# Patient Record
Sex: Female | Born: 1985 | Race: Black or African American | Hispanic: No | Marital: Single | Smoking: Former smoker
Health system: Southern US, Community
[De-identification: ages and names within clinical notes are randomized; demographics above are authoritative.]

## PROBLEM LIST (undated history)

## (undated) ENCOUNTER — Inpatient Hospital Stay (HOSPITAL_COMMUNITY): Payer: Self-pay

## (undated) DIAGNOSIS — O139 Gestational [pregnancy-induced] hypertension without significant proteinuria, unspecified trimester: Secondary | ICD-10-CM

## (undated) DIAGNOSIS — I10 Essential (primary) hypertension: Secondary | ICD-10-CM

## (undated) DIAGNOSIS — J45909 Unspecified asthma, uncomplicated: Secondary | ICD-10-CM

## (undated) HISTORY — PX: MOUTH SURGERY: SHX715

## (undated) HISTORY — PX: NO PAST SURGERIES: SHX2092

## (undated) HISTORY — PX: WISDOM TOOTH EXTRACTION: SHX21

---
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
## 2004-05-24 ENCOUNTER — Emergency Department (HOSPITAL_COMMUNITY): Admission: EM | Admit: 2004-05-24 | Discharge: 2004-05-24 | Payer: Self-pay | Admitting: Emergency Medicine

## 2006-02-14 ENCOUNTER — Emergency Department (HOSPITAL_COMMUNITY): Admission: EM | Admit: 2006-02-14 | Discharge: 2006-02-15 | Payer: Self-pay | Admitting: Emergency Medicine

## 2006-04-30 ENCOUNTER — Emergency Department (HOSPITAL_COMMUNITY): Admission: EM | Admit: 2006-04-30 | Discharge: 2006-04-30 | Payer: Self-pay | Admitting: Emergency Medicine

## 2006-06-09 ENCOUNTER — Ambulatory Visit: Payer: Self-pay | Admitting: Internal Medicine

## 2006-07-14 ENCOUNTER — Ambulatory Visit: Payer: Self-pay | Admitting: *Deleted

## 2006-07-14 ENCOUNTER — Ambulatory Visit: Payer: Self-pay | Admitting: Internal Medicine

## 2006-08-18 ENCOUNTER — Encounter (INDEPENDENT_AMBULATORY_CARE_PROVIDER_SITE_OTHER): Payer: Self-pay | Admitting: Nurse Practitioner

## 2006-08-18 ENCOUNTER — Encounter (INDEPENDENT_AMBULATORY_CARE_PROVIDER_SITE_OTHER): Payer: Self-pay | Admitting: Family Medicine

## 2006-08-18 ENCOUNTER — Ambulatory Visit: Payer: Self-pay | Admitting: Internal Medicine

## 2006-08-18 LAB — CONVERTED CEMR LAB: Chlamydia, DNA Probe: NEGATIVE

## 2006-08-29 ENCOUNTER — Encounter: Payer: Self-pay | Admitting: Nurse Practitioner

## 2006-08-29 DIAGNOSIS — J45909 Unspecified asthma, uncomplicated: Secondary | ICD-10-CM

## 2006-09-12 ENCOUNTER — Telehealth (INDEPENDENT_AMBULATORY_CARE_PROVIDER_SITE_OTHER): Payer: Self-pay | Admitting: Internal Medicine

## 2006-09-15 ENCOUNTER — Telehealth (INDEPENDENT_AMBULATORY_CARE_PROVIDER_SITE_OTHER): Payer: Self-pay | Admitting: Internal Medicine

## 2006-09-18 ENCOUNTER — Ambulatory Visit: Payer: Self-pay | Admitting: Nurse Practitioner

## 2006-09-18 DIAGNOSIS — K6289 Other specified diseases of anus and rectum: Secondary | ICD-10-CM

## 2007-01-21 ENCOUNTER — Ambulatory Visit: Payer: Self-pay | Admitting: Nurse Practitioner

## 2007-01-21 DIAGNOSIS — J029 Acute pharyngitis, unspecified: Secondary | ICD-10-CM | POA: Insufficient documentation

## 2007-05-15 ENCOUNTER — Telehealth (INDEPENDENT_AMBULATORY_CARE_PROVIDER_SITE_OTHER): Payer: Self-pay | Admitting: Nurse Practitioner

## 2007-05-29 ENCOUNTER — Encounter (INDEPENDENT_AMBULATORY_CARE_PROVIDER_SITE_OTHER): Payer: Self-pay | Admitting: *Deleted

## 2007-10-15 ENCOUNTER — Ambulatory Visit: Payer: Self-pay | Admitting: Nurse Practitioner

## 2007-10-15 DIAGNOSIS — M7989 Other specified soft tissue disorders: Secondary | ICD-10-CM

## 2008-01-07 ENCOUNTER — Emergency Department (HOSPITAL_COMMUNITY): Admission: EM | Admit: 2008-01-07 | Discharge: 2008-01-07 | Payer: Self-pay | Admitting: Emergency Medicine

## 2008-05-06 ENCOUNTER — Ambulatory Visit: Payer: Self-pay | Admitting: Nurse Practitioner

## 2008-05-06 LAB — CONVERTED CEMR LAB
Beta hcg, urine, semiquantitative: POSITIVE
Bilirubin Urine: NEGATIVE
Blood in Urine, dipstick: NEGATIVE
Ketones, urine, test strip: NEGATIVE
Nitrite: NEGATIVE
Specific Gravity, Urine: 1.025
Urobilinogen, UA: 0.2

## 2008-05-22 ENCOUNTER — Inpatient Hospital Stay (HOSPITAL_COMMUNITY): Admission: AD | Admit: 2008-05-22 | Discharge: 2008-05-23 | Payer: Self-pay | Admitting: Family Medicine

## 2008-05-22 ENCOUNTER — Emergency Department (HOSPITAL_COMMUNITY): Admission: EM | Admit: 2008-05-22 | Discharge: 2008-05-22 | Payer: Self-pay | Admitting: Family Medicine

## 2008-05-24 ENCOUNTER — Inpatient Hospital Stay (HOSPITAL_COMMUNITY): Admission: AD | Admit: 2008-05-24 | Discharge: 2008-05-24 | Payer: Self-pay | Admitting: Obstetrics & Gynecology

## 2009-01-22 ENCOUNTER — Inpatient Hospital Stay (HOSPITAL_COMMUNITY): Admission: AD | Admit: 2009-01-22 | Discharge: 2009-01-22 | Payer: Self-pay | Admitting: Obstetrics & Gynecology

## 2009-01-22 ENCOUNTER — Ambulatory Visit: Payer: Self-pay | Admitting: Obstetrics and Gynecology

## 2009-01-22 ENCOUNTER — Emergency Department (HOSPITAL_COMMUNITY): Admission: EM | Admit: 2009-01-22 | Discharge: 2009-01-22 | Payer: Self-pay | Admitting: Family Medicine

## 2009-04-13 ENCOUNTER — Ambulatory Visit (HOSPITAL_COMMUNITY): Admission: RE | Admit: 2009-04-13 | Discharge: 2009-04-13 | Payer: Self-pay | Admitting: Obstetrics

## 2009-07-15 ENCOUNTER — Inpatient Hospital Stay (HOSPITAL_COMMUNITY): Admission: AD | Admit: 2009-07-15 | Discharge: 2009-07-15 | Payer: Self-pay | Admitting: Obstetrics

## 2009-08-15 ENCOUNTER — Inpatient Hospital Stay (HOSPITAL_COMMUNITY): Admission: AD | Admit: 2009-08-15 | Discharge: 2009-08-19 | Payer: Self-pay | Admitting: Obstetrics

## 2009-08-16 ENCOUNTER — Ambulatory Visit (HOSPITAL_COMMUNITY): Admission: RE | Admit: 2009-08-16 | Discharge: 2009-08-16 | Payer: Self-pay | Admitting: Obstetrics

## 2009-08-17 ENCOUNTER — Encounter (INDEPENDENT_AMBULATORY_CARE_PROVIDER_SITE_OTHER): Payer: Self-pay | Admitting: Obstetrics

## 2009-12-06 ENCOUNTER — Emergency Department (HOSPITAL_COMMUNITY)
Admission: EM | Admit: 2009-12-06 | Discharge: 2009-12-06 | Payer: Self-pay | Source: Home / Self Care | Admitting: Family Medicine

## 2010-01-10 ENCOUNTER — Emergency Department (HOSPITAL_COMMUNITY)
Admission: EM | Admit: 2010-01-10 | Discharge: 2010-01-10 | Payer: Self-pay | Source: Home / Self Care | Admitting: Emergency Medicine

## 2010-03-23 LAB — CBC
HCT: 36.4 % (ref 36.0–46.0)
HCT: 38 % (ref 36.0–46.0)
HCT: 41.4 % (ref 36.0–46.0)
Hemoglobin: 13.1 g/dL (ref 12.0–15.0)
MCH: 29.5 pg (ref 26.0–34.0)
MCH: 29.8 pg (ref 26.0–34.0)
MCHC: 34.5 g/dL (ref 30.0–36.0)
MCV: 86.6 fL (ref 78.0–100.0)
MCV: 88.7 fL (ref 78.0–100.0)
Platelets: 161 10*3/uL (ref 150–400)
Platelets: 251 10*3/uL (ref 150–400)
Platelets: 270 10*3/uL (ref 150–400)
RBC: 4 MIL/uL (ref 3.87–5.11)
RBC: 4.2 MIL/uL (ref 3.87–5.11)
RBC: 4.71 MIL/uL (ref 3.87–5.11)
RBC: 4.74 MIL/uL (ref 3.87–5.11)
RDW: 16.9 % — ABNORMAL HIGH (ref 11.5–15.5)
RDW: 17.9 % — ABNORMAL HIGH (ref 11.5–15.5)
WBC: 10.4 10*3/uL (ref 4.0–10.5)
WBC: 15.2 10*3/uL — ABNORMAL HIGH (ref 4.0–10.5)
WBC: 7.5 10*3/uL (ref 4.0–10.5)
WBC: 8.2 10*3/uL (ref 4.0–10.5)

## 2010-03-23 LAB — COMPREHENSIVE METABOLIC PANEL
ALT: 22 U/L (ref 0–35)
ALT: 23 U/L (ref 0–35)
AST: 28 U/L (ref 0–37)
Albumin: 2.3 g/dL — ABNORMAL LOW (ref 3.5–5.2)
Albumin: 2.6 g/dL — ABNORMAL LOW (ref 3.5–5.2)
Alkaline Phosphatase: 115 U/L (ref 39–117)
Alkaline Phosphatase: 137 U/L — ABNORMAL HIGH (ref 39–117)
BUN: 4 mg/dL — ABNORMAL LOW (ref 6–23)
CO2: 26 mEq/L (ref 19–32)
Calcium: 8.7 mg/dL (ref 8.4–10.5)
Chloride: 104 mEq/L (ref 96–112)
Chloride: 106 mEq/L (ref 96–112)
Creatinine, Ser: 0.59 mg/dL (ref 0.4–1.2)
GFR calc Af Amer: >60 mL/min (ref >60)
GFR calc non Af Amer: >60 mL/min (ref >60)
Glucose, Bld: 85 mg/dL (ref 70–99)
Potassium: 3.6 mEq/L (ref 3.5–5.1)
Potassium: 3.8 mEq/L (ref 3.5–5.1)
Sodium: 138 mEq/L (ref 135–145)
Total Bilirubin: 0.3 mg/dL (ref 0.3–1.2)
Total Bilirubin: 0.3 mg/dL (ref 0.3–1.2)
Total Protein: 5.8 g/dL — ABNORMAL LOW (ref 6.0–8.3)

## 2010-03-23 LAB — LACTATE DEHYDROGENASE: LDH: 140 U/L (ref 94–250)

## 2010-03-23 LAB — URIC ACID: Uric Acid, Serum: 6.7 mg/dL (ref 2.4–7.0)

## 2010-03-25 LAB — POCT URINALYSIS DIP (DEVICE)
Protein, ur: NEGATIVE mg/dL
Specific Gravity, Urine: 1.01 (ref 1.005–1.030)
Urobilinogen, UA: 0.2 mg/dL (ref 0.0–1.0)

## 2010-03-25 LAB — POCT I-STAT, CHEM 8
BUN: 8 mg/dL (ref 6–23)
Calcium, Ion: 1.15 mmol/L (ref 1.12–1.32)
Chloride: 103 mEq/L (ref 96–112)
Glucose, Bld: 81 mg/dL (ref 70–99)
HCT: 41 % (ref 36.0–46.0)
Potassium: 3.5 mEq/L (ref 3.5–5.1)

## 2010-03-25 LAB — URINE CULTURE: Colony Count: >=100000

## 2010-03-25 LAB — URINALYSIS, ROUTINE W REFLEX MICROSCOPIC
Bilirubin Urine: NEGATIVE
Hgb urine dipstick: NEGATIVE
Specific Gravity, Urine: 1.015 (ref 1.005–1.030)
pH: 6 (ref 5.0–8.0)

## 2010-03-25 LAB — GC/CHLAMYDIA PROBE AMP, GENITAL: Chlamydia, DNA Probe: NEGATIVE

## 2010-04-15 ENCOUNTER — Inpatient Hospital Stay (INDEPENDENT_AMBULATORY_CARE_PROVIDER_SITE_OTHER)
Admission: RE | Admit: 2010-04-15 | Discharge: 2010-04-15 | Disposition: A | Discharge status: Home / Self Care | Payer: Self-pay | Source: Ambulatory Visit | Attending: Emergency Medicine | Admitting: Emergency Medicine

## 2010-04-15 DIAGNOSIS — J309 Allergic rhinitis, unspecified: Secondary | ICD-10-CM

## 2010-04-15 DIAGNOSIS — J45909 Unspecified asthma, uncomplicated: Secondary | ICD-10-CM

## 2010-04-17 LAB — POCT I-STAT, CHEM 8
Calcium, Ion: 1.23 mmol/L (ref 1.12–1.32)
Chloride: 101 mEq/L (ref 96–112)
Glucose, Bld: 91 mg/dL (ref 70–99)
HCT: 41 % (ref 36.0–46.0)
Hemoglobin: 13.9 g/dL (ref 12.0–15.0)
TCO2: 28 mmol/L (ref 0–100)

## 2010-04-17 LAB — DIFFERENTIAL
Basophils Absolute: 0 10*3/uL (ref 0.0–0.1)
Eosinophils Relative: 4 % (ref 0–5)
Lymphocytes Relative: 19 % (ref 12–46)
Lymphs Abs: 1.7 10*3/uL (ref 0.7–4.0)
Neutro Abs: 6.7 10*3/uL (ref 1.7–7.7)
Neutrophils Relative %: 74 % (ref 43–77)

## 2010-04-17 LAB — WET PREP, GENITAL
Clue Cells Wet Prep HPF POC: NONE SEEN
Trich, Wet Prep: NONE SEEN
Yeast Wet Prep HPF POC: NONE SEEN

## 2010-04-17 LAB — CBC
MCHC: 35.1 g/dL (ref 30.0–36.0)
MCV: 89.7 fL (ref 78.0–100.0)
Platelets: 277 10*3/uL (ref 150–400)
Platelets: 302 10*3/uL (ref 150–400)
RBC: 4.19 MIL/uL (ref 3.87–5.11)
RDW: 13.4 % (ref 11.5–15.5)
WBC: 9 10*3/uL (ref 4.0–10.5)

## 2010-04-17 LAB — HCG, QUANTITATIVE, PREGNANCY
hCG, Beta Chain, Quant, S: 105 m[IU]/mL — ABNORMAL HIGH (ref <5)
hCG, Beta Chain, Quant, S: 1147 m[IU]/mL — ABNORMAL HIGH (ref <5)

## 2010-04-17 LAB — POCT PREGNANCY, URINE: Preg Test, Ur: POSITIVE

## 2010-04-17 LAB — GC/CHLAMYDIA PROBE AMP, GENITAL: GC Probe Amp, Genital: NEGATIVE

## 2010-04-17 LAB — ABO/RH: ABO/RH(D): O POS

## 2010-05-23 ENCOUNTER — Emergency Department (HOSPITAL_COMMUNITY)
Admission: EM | Admit: 2010-05-23 | Discharge: 2010-05-23 | Disposition: A | Discharge status: Home / Self Care | Payer: Self-pay | Attending: Emergency Medicine | Admitting: Emergency Medicine

## 2010-05-23 DIAGNOSIS — Z79899 Other long term (current) drug therapy: Secondary | ICD-10-CM | POA: Insufficient documentation

## 2010-05-23 DIAGNOSIS — J45909 Unspecified asthma, uncomplicated: Secondary | ICD-10-CM | POA: Insufficient documentation

## 2010-09-11 ENCOUNTER — Emergency Department (HOSPITAL_COMMUNITY)
Admission: EM | Admit: 2010-09-11 | Discharge: 2010-09-11 | Disposition: A | Discharge status: Home / Self Care | Payer: Self-pay | Attending: Emergency Medicine | Admitting: Emergency Medicine

## 2010-09-11 DIAGNOSIS — R21 Rash and other nonspecific skin eruption: Secondary | ICD-10-CM | POA: Insufficient documentation

## 2010-09-11 DIAGNOSIS — J45909 Unspecified asthma, uncomplicated: Secondary | ICD-10-CM | POA: Insufficient documentation

## 2010-09-11 DIAGNOSIS — B029 Zoster without complications: Secondary | ICD-10-CM | POA: Insufficient documentation

## 2010-10-12 ENCOUNTER — Emergency Department (HOSPITAL_COMMUNITY)
Admission: EM | Admit: 2010-10-12 | Discharge: 2010-10-13 | Disposition: A | Discharge status: Home / Self Care | Payer: Self-pay | Attending: Emergency Medicine | Admitting: Emergency Medicine

## 2011-04-08 IMAGING — US US OB TRANSVAGINAL
1 series · 14 of 23 positions shown · non-contrast
Comparison: 05/22/2008.

CLINICAL DATA: Vaginal bleeding at 6 weeks and 2 days of pregnancy
by last menstrual period.  Decreased quantitative beta HCG,
currently 105.

TRANSVAGINAL OB ULTRASOUND
TECHNIQUE: Transvaginal ultrasound was performed for evaluation of
the gestation as well as the maternal uterus and adnexal regions.

[Series 1: us ob transvaginal · 14 of 23 slices shown]
[im 1/23]
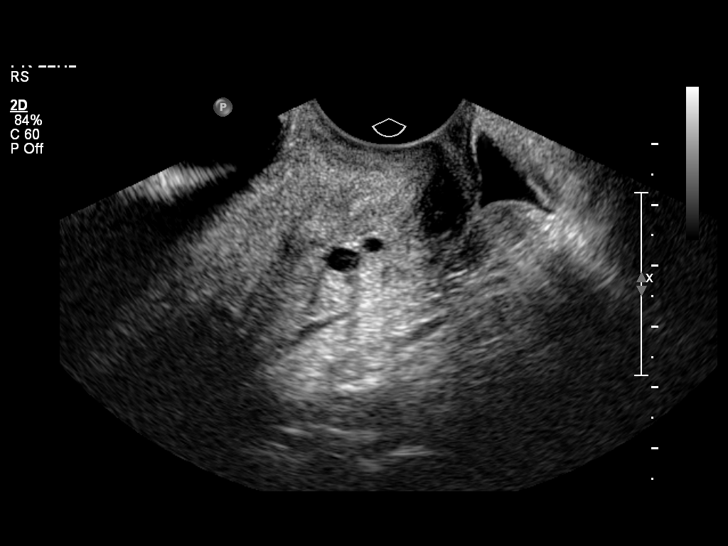
[im 3/23]
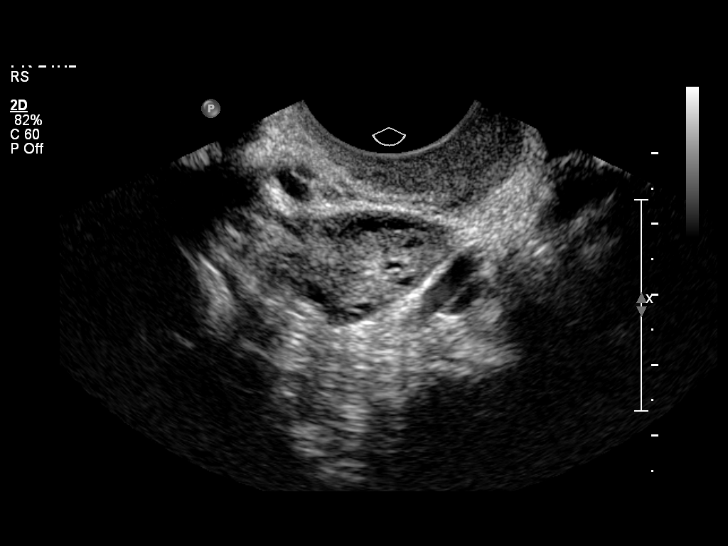
[im 5/23]
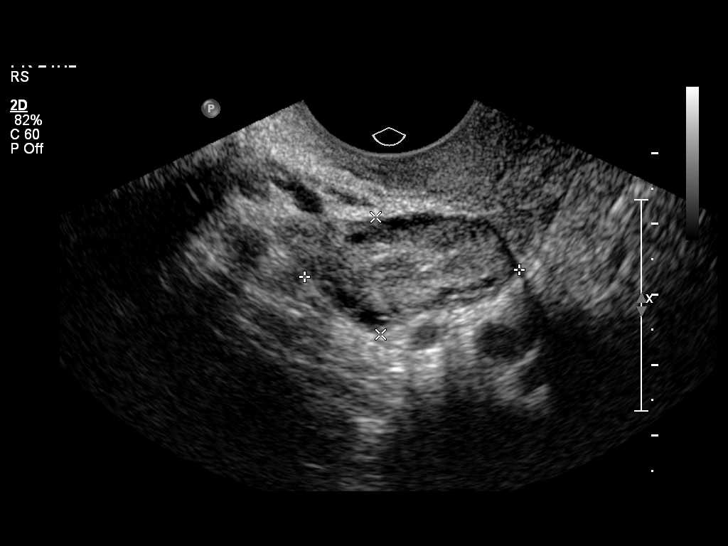
[im 6/23]
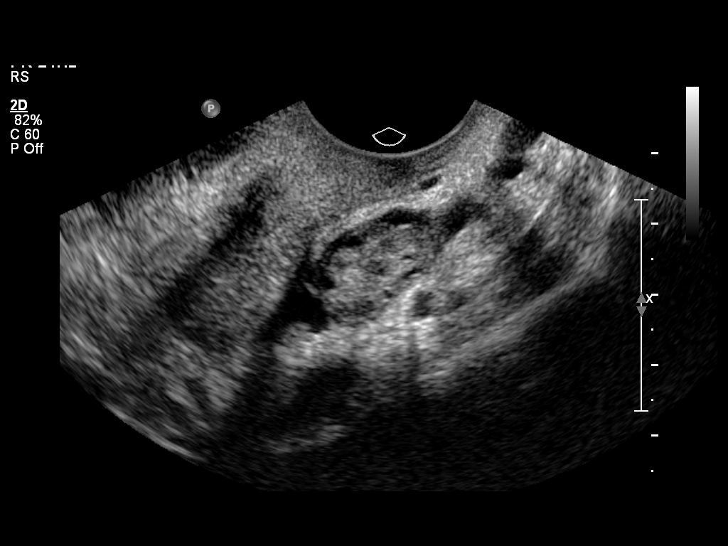
[im 8/23]
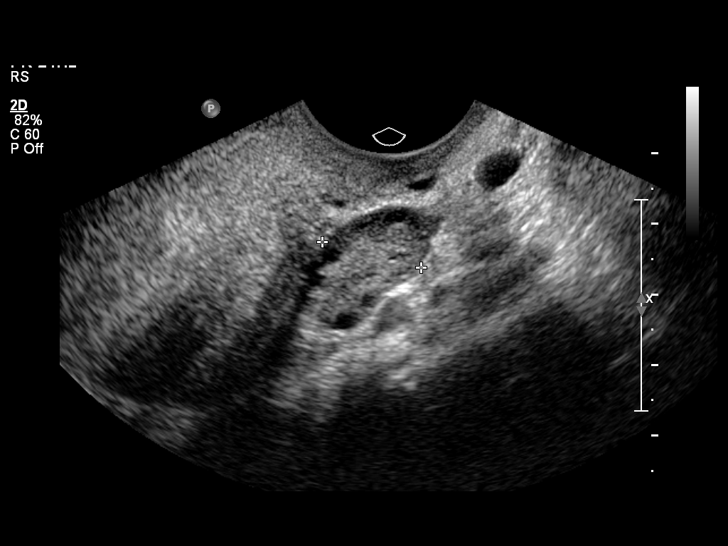
[im 10/23]
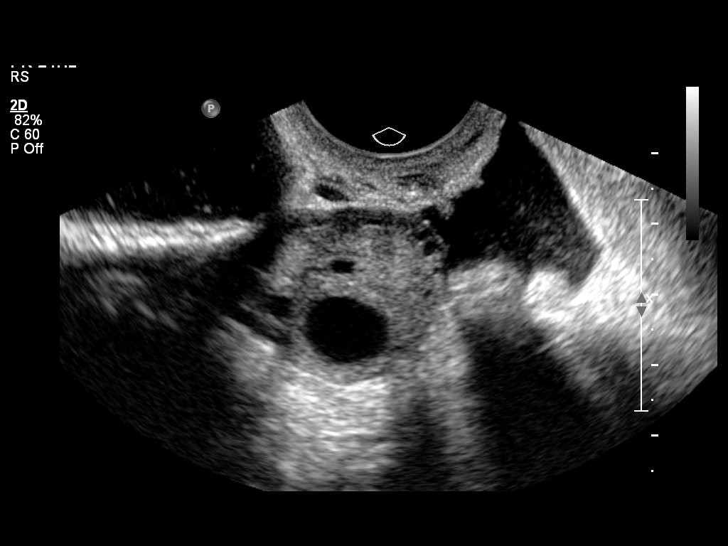
[im 11/23]
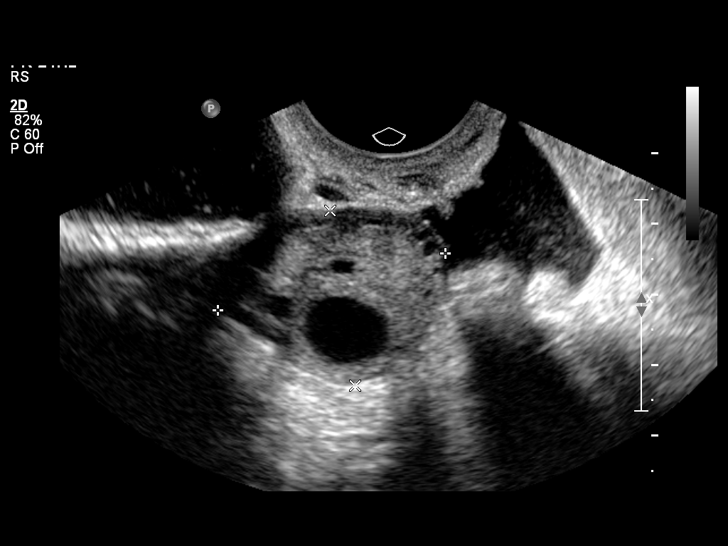
[im 13/23]
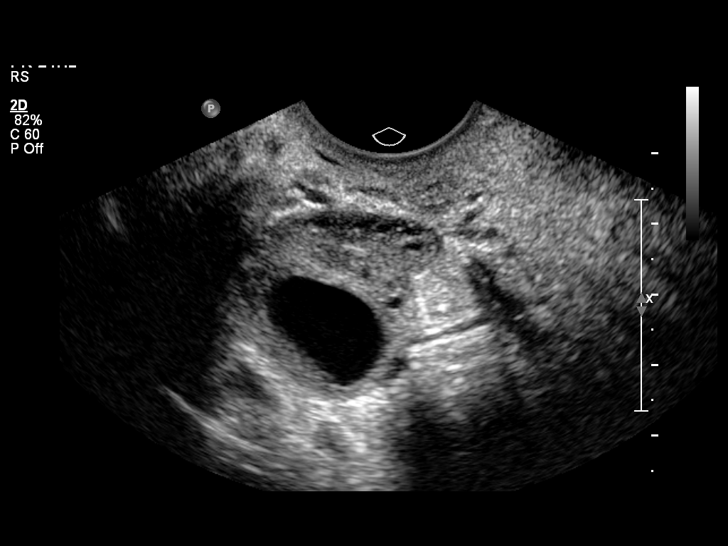
[im 14/23]
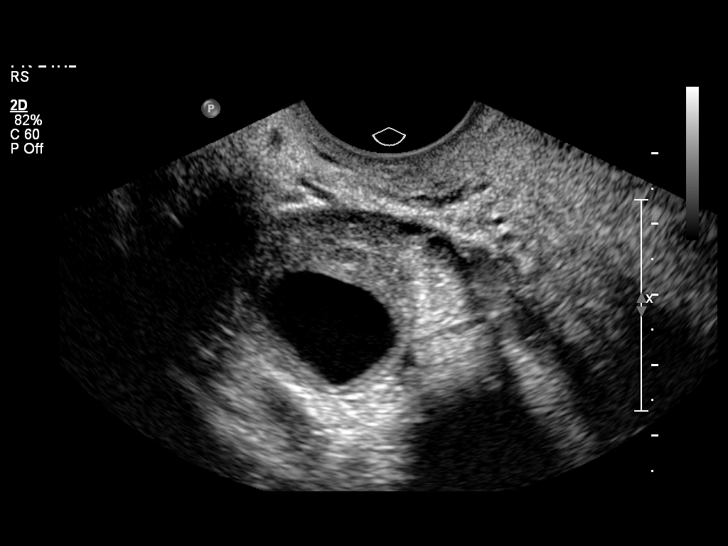
[im 16/23]
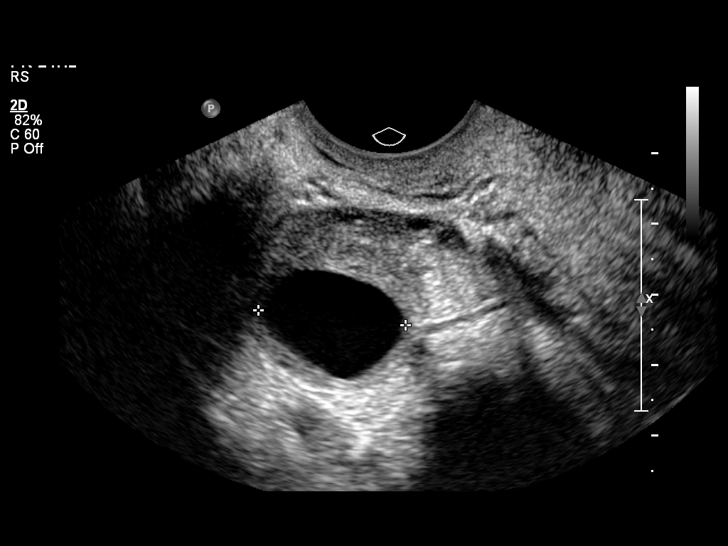
[im 18/23]
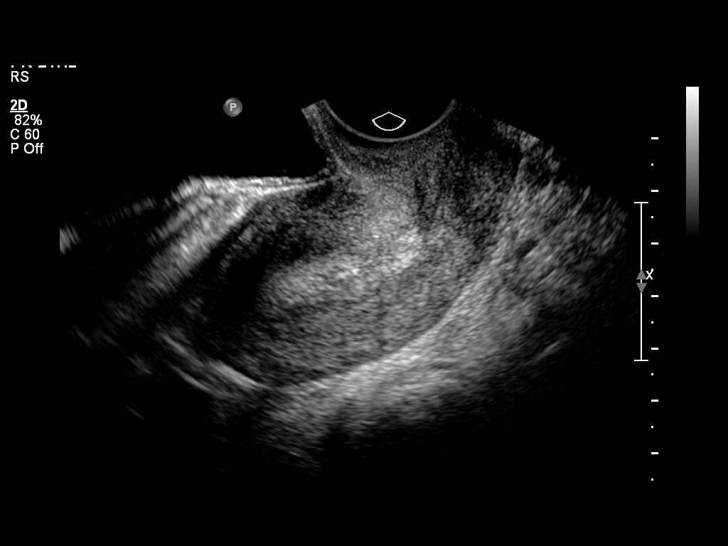
[im 19/23]
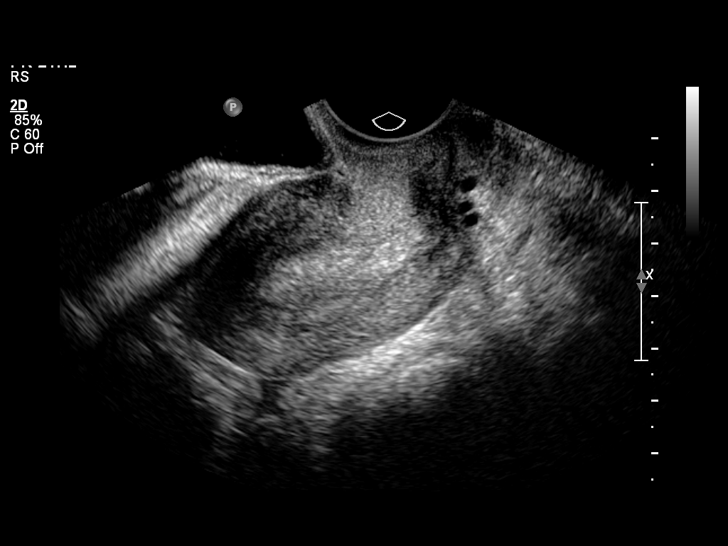
[im 21/23]
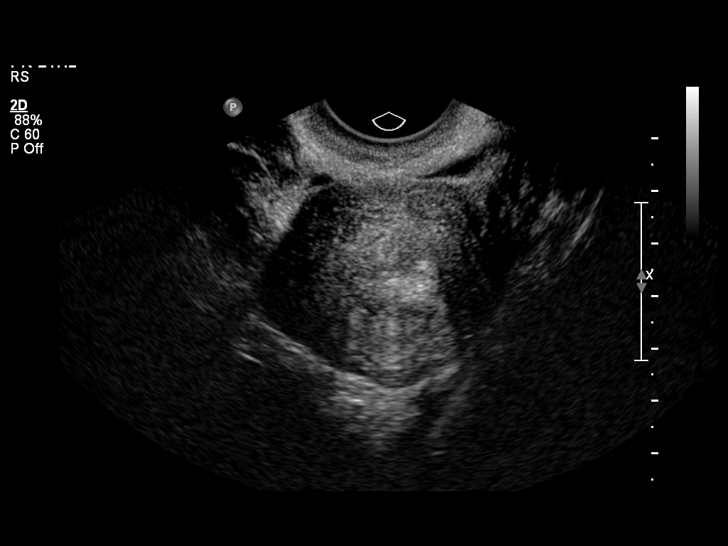
[im 23/23]
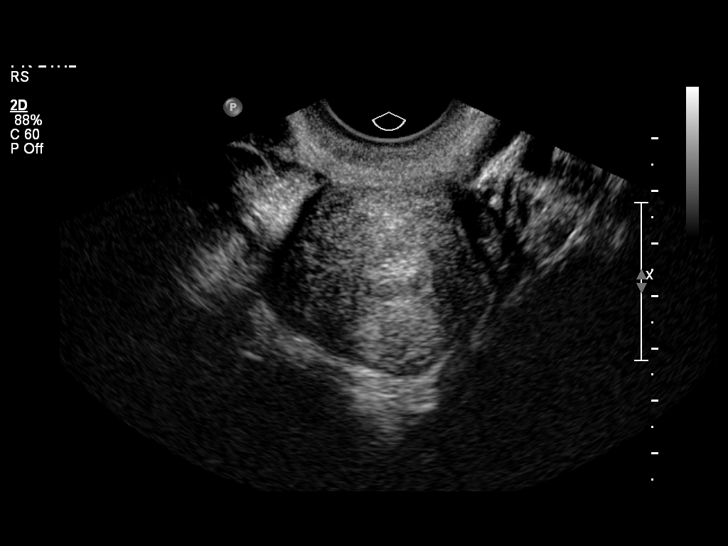

[14 of 23 positions shown; findings below may reference images not displayed]

FINDINGS: The previously demonstrated intrauterine pregnancy is no
longer seen.  The endometrial stripe currently has a normal
appearance, measuring 9.3 mm in maximum thickness.  2.1 cm right
ovarian simple corpus luteum cyst.  Normal appearing maternal left
ovary.  Trace amount of free peritoneal fluid.
IMPRESSION: Spontaneous abortion.

## 2012-04-06 ENCOUNTER — Encounter (HOSPITAL_COMMUNITY): Payer: Self-pay | Admitting: Emergency Medicine

## 2012-04-06 ENCOUNTER — Emergency Department (HOSPITAL_COMMUNITY)
Admission: EM | Admit: 2012-04-06 | Discharge: 2012-04-06 | Disposition: A | Discharge status: Home / Self Care | Payer: Self-pay | Attending: Emergency Medicine | Admitting: Emergency Medicine

## 2012-04-06 DIAGNOSIS — R05 Cough: Secondary | ICD-10-CM | POA: Insufficient documentation

## 2012-04-06 DIAGNOSIS — Z79899 Other long term (current) drug therapy: Secondary | ICD-10-CM | POA: Insufficient documentation

## 2012-04-06 DIAGNOSIS — R059 Cough, unspecified: Secondary | ICD-10-CM | POA: Insufficient documentation

## 2012-04-06 DIAGNOSIS — J069 Acute upper respiratory infection, unspecified: Secondary | ICD-10-CM | POA: Insufficient documentation

## 2012-04-06 DIAGNOSIS — J45901 Unspecified asthma with (acute) exacerbation: Secondary | ICD-10-CM | POA: Insufficient documentation

## 2012-04-06 DIAGNOSIS — J3489 Other specified disorders of nose and nasal sinuses: Secondary | ICD-10-CM | POA: Insufficient documentation

## 2012-04-06 HISTORY — DX: Unspecified asthma, uncomplicated: J45.909

## 2012-04-06 MED ORDER — PREDNISONE 20 MG PO TABS: ORAL_TABLET | ORAL | Status: DC

## 2012-04-06 MED ORDER — ALBUTEROL SULFATE HFA 108 (90 BASE) MCG/ACT IN AERS: 2.0000 | INHALATION_SPRAY | RESPIRATORY_TRACT | Status: DC | PRN

## 2012-04-06 MED ORDER — ALBUTEROL SULFATE (5 MG/ML) 0.5% IN NEBU
5.0000 mg | INHALATION_SOLUTION | Freq: Once | RESPIRATORY_TRACT | Status: AC
  Administered 2012-04-06: 5 mg via RESPIRATORY_TRACT
  Filled 2012-04-06: qty 1

## 2012-04-06 NOTE — ED Provider Notes (Signed)
History     CSN: [id number]  Arrival date & time 04/06/12  4098   First MD Initiated Contact with Patient 04/06/12 2300      Chief Complaint  Patient presents with  . Shortness of Breath    (Consider location/radiation/quality/duration/timing/severity/associated sxs/prior treatment) HPI This 27 year old female has a history of asthma, she ran out of her inhaler, she states she's had a cold for the last few days with some mild nasal congestion and a nonproductive cough but no fever, no pain, today she was short of breath sitting to the ED, she has had some mild wheezing for the last couple of days but really only felt short of breath today, she is no treatment prior to arrival, she is no confusion no rash no vomiting no diarrhea no body aches no other concerns. There is no treatment prior to arrival. Her shortness of breath is mild to moderate upon arrival and much improved after nebulizer treatment in the ED her shortness of breath is now essentially resolved. Past Medical History  Diagnosis Date  . Asthma     History reviewed. No pertinent past surgical history.  History reviewed. No pertinent family history.  History  Substance Use Topics  . Smoking status: Never Smoker   . Smokeless tobacco: Not on file  . Alcohol Use: No    OB History   Grav Para Term Preterm Abortions TAB SAB Ect Mult Living                  Review of Systems 10 Systems reviewed and are negative for acute change except as noted in the HPI. Allergies  Shellfish allergy  Home Medications   Current Outpatient Rx  Name  Route  Sig  Dispense  Refill  . albuterol (PROVENTIL HFA;VENTOLIN HFA) 108 (90 BASE) MCG/ACT inhaler   Inhalation   Inhale 2 puffs into the lungs every 2 (two) hours as needed for wheezing or shortness of breath (cough).   1 Inhaler   0   . predniSONE (DELTASONE) 20 MG tablet      3 tabs po day one, then 2 po daily x 4 days   11 tablet   0     BP 142/124  Pulse 103   Temp(Src) 98.9 F (37.2 C) (Oral)  Resp 24  SpO2 100%  LMP 02/21/2012  Physical Exam  Nursing note and vitals reviewed. Constitutional:  Awake, alert, nontoxic appearance.  HENT:  Head: Atraumatic.  Eyes: Right eye exhibits no discharge. Left eye exhibits no discharge.  Neck: Neck supple.  Cardiovascular: Normal rate and regular rhythm.   No murmur heard. Pulmonary/Chest: Effort normal. No respiratory distress. She has wheezes. She has no rales. She exhibits no tenderness.  After nebulizer the ED the patient has normal turgor pulse oximetry 100%, her lungs are essentially clear except for some faint end expiratory wheezes bilaterally, she is speaking full sepsis without difficulty and states she feels ready for home  Abdominal: Soft. There is no tenderness. There is no rebound.  Musculoskeletal: She exhibits no tenderness.  Baseline ROM, no obvious new focal weakness.  Neurological: She is alert.  Mental status and motor strength appears baseline for patient and situation.  Skin: No rash noted.  Psychiatric: She has a normal mood and affect.    ED Course  Procedures (including critical care time)  Labs Reviewed - No data to display No results found.   1. Asthma attack   2. URI (upper respiratory infection)  MDM  I doubt any other EMC precluding discharge at this time including, but not necessarily limited to the following:SBI.        Hurman Horn, MD 04/20/12 [phone]

## 2012-04-06 NOTE — ED Notes (Signed)
Patient states that she is having SOB  - has had trouble breathing all day. The patient is out of her inhaler

## 2012-05-12 ENCOUNTER — Inpatient Hospital Stay (HOSPITAL_COMMUNITY)
Admission: AD | Admit: 2012-05-12 | Discharge: 2012-05-12 | Disposition: A | Discharge status: Home / Self Care | Payer: Self-pay | Source: Ambulatory Visit | Attending: Obstetrics & Gynecology | Admitting: Obstetrics & Gynecology

## 2012-05-12 ENCOUNTER — Encounter (HOSPITAL_COMMUNITY): Payer: Self-pay | Admitting: *Deleted

## 2012-05-12 ENCOUNTER — Inpatient Hospital Stay (HOSPITAL_COMMUNITY): Payer: Self-pay

## 2012-05-12 DIAGNOSIS — O2390 Unspecified genitourinary tract infection in pregnancy, unspecified trimester: Secondary | ICD-10-CM | POA: Insufficient documentation

## 2012-05-12 DIAGNOSIS — O200 Threatened abortion: Secondary | ICD-10-CM | POA: Insufficient documentation

## 2012-05-12 DIAGNOSIS — O209 Hemorrhage in early pregnancy, unspecified: Secondary | ICD-10-CM

## 2012-05-12 DIAGNOSIS — O2341 Unspecified infection of urinary tract in pregnancy, first trimester: Secondary | ICD-10-CM

## 2012-05-12 DIAGNOSIS — N390 Urinary tract infection, site not specified: Secondary | ICD-10-CM | POA: Insufficient documentation

## 2012-05-12 HISTORY — DX: Gestational (pregnancy-induced) hypertension without significant proteinuria, unspecified trimester: O13.9

## 2012-05-12 HISTORY — DX: Essential (primary) hypertension: I10

## 2012-05-12 LAB — URINALYSIS, ROUTINE W REFLEX MICROSCOPIC
Nitrite: POSITIVE — AB
Protein, ur: 30 mg/dL — AB
Specific Gravity, Urine: 1.02 (ref 1.005–1.030)
Urobilinogen, UA: 0.2 mg/dL (ref 0.0–1.0)

## 2012-05-12 LAB — CBC
Hemoglobin: 13.9 g/dL (ref 12.0–15.0)
MCH: 29.4 pg (ref 26.0–34.0)
MCHC: 35.1 g/dL (ref 30.0–36.0)
RDW: 13.5 % (ref 11.5–15.5)

## 2012-05-12 LAB — WET PREP, GENITAL: Yeast Wet Prep HPF POC: NONE SEEN

## 2012-05-12 LAB — POCT PREGNANCY, URINE: Preg Test, Ur: POSITIVE — AB

## 2012-05-12 LAB — URINE MICROSCOPIC-ADD ON

## 2012-05-12 MED ORDER — KETOROLAC TROMETHAMINE 60 MG/2ML IM SOLN
60.0000 mg | Freq: Once | INTRAMUSCULAR | Status: AC
  Administered 2012-05-12: 60 mg via INTRAMUSCULAR
  Filled 2012-05-12: qty 2

## 2012-05-12 MED ORDER — NITROFURANTOIN MONOHYD MACRO 100 MG PO CAPS: 100.0000 mg | ORAL_CAPSULE | Freq: Two times a day (BID) | ORAL | Status: DC

## 2012-05-12 NOTE — Progress Notes (Signed)
Pamelia Hoit NP in to discuss u/s and lab results with pt and d/c plan. Written and verbal d/c instructions given and understanding voiced.

## 2012-05-12 NOTE — MAU Provider Note (Signed)
Attestation of Attending Supervision of Advanced Practitioner (PA/CNM/NP): Evaluation and management procedures were performed by the Advanced Practitioner under my supervision and collaboration.  I have reviewed the Advanced Practitioner's note and chart, and I agree with the management and plan.  Charvis Lightner, MD, FACOG Attending Obstetrician & Gynecologist Faculty Practice, [facility]  

## 2012-05-12 NOTE — MAU Provider Note (Signed)
History     CSN: [id number]  Arrival date and time: 05/12/12 1302   First Provider Initiated Contact with Patient 05/12/12 1344      Chief Complaint  Patient presents with  . Threatened Miscarriage   HPI  Pt [id number] @ ?[redacted] weeks pregnant and presents with bleeding in pregnancy for 2 days along with cramping.  Pt feels a lot of pressure and cramps like contractions.   Pt has not passed any clots or tissue. Pt has not taken any medication for the pain.  Past Medical History  Diagnosis Date  . Asthma   . Hypertension   . Pregnancy induced hypertension     History reviewed. No pertinent past surgical history.  Family History  Problem Relation Age of Onset  . Hypertension Mother   . Diabetes Father   . Hypertension Father     History  Substance Use Topics  . Smoking status: Never Smoker   . Smokeless tobacco: Not on file  . Alcohol Use: No    Allergies:  Allergies  Allergen Reactions  . Shellfish Allergy     anaphylaxis    Prescriptions prior to admission  Medication Sig Dispense Refill  . albuterol (PROVENTIL HFA;VENTOLIN HFA) 108 (90 BASE) MCG/ACT inhaler Inhale 2 puffs into the lungs every 2 (two) hours as needed for wheezing or shortness of breath (cough).  1 Inhaler  0  . predniSONE (DELTASONE) 20 MG tablet 3 tabs po day one, then 2 po daily x 4 days      . [DISCONTINUED] predniSONE (DELTASONE) 20 MG tablet 3 tabs po day one, then 2 po daily x 4 days  11 tablet  0    Review of Systems  Constitutional: Negative for fever and chills.  Gastrointestinal: Positive for abdominal pain. Negative for nausea, vomiting, diarrhea and constipation.  Genitourinary: Negative for dysuria, urgency and frequency.   Physical Exam   Blood pressure 134/95, pulse 99, temperature 98.4 F (36.9 C), resp. rate 20, height 5\' 1"  (1.549 m), weight 91.627 kg (202 lb), last menstrual period 03/05/2012.  Physical Exam  Vitals reviewed. Constitutional: She is oriented to person,  place, and time. She appears well-developed and well-nourished.  Uncomfortable appearing  HENT:  Head: Normocephalic.  Eyes: Pupils are equal, round, and reactive to light.  Neck: Normal range of motion.  Cardiovascular: Normal rate.   Respiratory: Effort normal.  GI: Soft. She exhibits no distension. There is no tenderness. There is no rebound and no guarding.  Genitourinary:  Small amount of bright red blood in vault with small ?tissue vs. Clot from os; cervix closed NT; uterus nontender difficult to assess size due to habitus.  Musculoskeletal: Normal range of motion.  Neurological: She is alert and oriented to person, place, and time.  Skin: Skin is warm and dry.  Psychiatric: She has a normal mood and affect.    MAU Course  Procedures toradol 60 mg IM given for pain- pain resolved Results for orders placed during the hospital encounter of 05/12/12 (from the past 24 hour(s))  URINALYSIS, ROUTINE W REFLEX MICROSCOPIC     Status: Abnormal   Collection Time    05/12/12  1:28 PM      Result Value Range   Color, Urine YELLOW  YELLOW   APPearance CLOUDY (*) CLEAR   Specific Gravity, Urine 1.020  1.005 - 1.030   pH 5.5  5.0 - 8.0   Glucose, UA NEGATIVE  NEGATIVE mg/dL   Hgb urine dipstick LARGE (*) NEGATIVE  Bilirubin Urine NEGATIVE  NEGATIVE   Ketones, ur NEGATIVE  NEGATIVE mg/dL   Protein, ur 30 (*) NEGATIVE mg/dL   Urobilinogen, UA 0.2  0.0 - 1.0 mg/dL   Nitrite POSITIVE (*) NEGATIVE   Leukocytes, UA MODERATE (*) NEGATIVE  URINE MICROSCOPIC-ADD ON     Status: Abnormal   Collection Time    05/12/12  1:28 PM      Result Value Range   Squamous Epithelial / LPF FEW (*) RARE   WBC, UA 3-6  <3 WBC/hpf   RBC / HPF 0-2  <3 RBC/hpf   Bacteria, UA MANY (*) RARE  POCT PREGNANCY, URINE     Status: Abnormal   Collection Time    05/12/12  1:38 PM      Result Value Range   Preg Test, Ur POSITIVE (*) NEGATIVE  WET PREP, GENITAL     Status: Abnormal   Collection Time    05/12/12   2:00 PM      Result Value Range   Yeast Wet Prep HPF POC NONE SEEN  NONE SEEN   Trich, Wet Prep NONE SEEN  NONE SEEN   Clue Cells Wet Prep HPF POC NONE SEEN  NONE SEEN   WBC, Wet Prep HPF POC FEW (*) NONE SEEN  HCG, QUANTITATIVE, PREGNANCY     Status: Abnormal   Collection Time    05/12/12  2:05 PM      Result Value Range   hCG, Beta Chain, Quant, S 467 (*) <5 mIU/mL  CBC     Status: None   Collection Time    05/12/12  2:05 PM      Result Value Range   WBC 10.1  4.0 - 10.5 K/uL   RBC 4.73  3.87 - 5.11 MIL/uL   Hemoglobin 13.9  12.0 - 15.0 g/dL   HCT 11.9  14.7 - 82.9 %   MCV 83.7  78.0 - 100.0 fL   MCH 29.4  26.0 - 34.0 pg   MCHC 35.1  30.0 - 36.0 g/dL   RDW 56.2  13.0 - 86.5 %   Platelets 357  150 - 400 K/uL   Urine culture pending  Assessment and Plan   bleeding in pregnancy- f/u 48 hours for repeat beta HCG UTI- Macrobid one BID for 7 days#14   Terry Abila 05/12/2012, 1:47 PM

## 2012-05-12 NOTE — MAU Note (Addendum)
Found out preg a couple wks ago, positive home test.  Yesterday started cramping and had pink spotting.  Today the cramping woke her, has gotten worse and bleeding is heavier.  Was at work and had to  [place], was dizzy.

## 2012-05-13 LAB — GC/CHLAMYDIA PROBE AMP
CT Probe RNA: NEGATIVE
GC Probe RNA: NEGATIVE

## 2012-05-15 LAB — URINE CULTURE: Colony Count: >=100000

## 2012-05-19 ENCOUNTER — Other Ambulatory Visit: Payer: Self-pay | Admitting: Obstetrics & Gynecology

## 2012-05-19 DIAGNOSIS — O2341 Unspecified infection of urinary tract in pregnancy, first trimester: Secondary | ICD-10-CM

## 2012-05-19 MED ORDER — CEPHALEXIN 500 MG PO CAPS: 500.0000 mg | ORAL_CAPSULE | Freq: Four times a day (QID) | ORAL | Status: DC

## 2012-05-19 NOTE — Progress Notes (Signed)
Patient had urine culture return with Klebsiella, intermediate sensitivity to Macrobid which she was prescribed.  Keflex prescribed.  Patient will be told to pick up Rx.  Jaynie Collins, MD, FACOG Attending Obstetrician & Gynecologist Faculty Practice, [facility] [place]

## 2012-05-19 NOTE — Progress Notes (Addendum)
Called pt to notify her of alternate Rx for antibiotic and heard message stating that the mailbox is full. I then called her work number and was told that Lonia Mad does not work there- number removed from demographics.  5/14  0925 - Called pt and heard same message that her mailbox is full. Certified letter will be mailed.

## 2012-05-20 ENCOUNTER — Encounter: Payer: Self-pay | Admitting: *Deleted

## 2012-07-17 ENCOUNTER — Emergency Department (INDEPENDENT_AMBULATORY_CARE_PROVIDER_SITE_OTHER)
Admission: EM | Admit: 2012-07-17 | Discharge: 2012-07-17 | Disposition: A | Discharge status: Home / Self Care | Payer: Self-pay | Source: Home / Self Care

## 2012-07-17 ENCOUNTER — Encounter (HOSPITAL_COMMUNITY): Payer: Self-pay | Admitting: Emergency Medicine

## 2012-07-17 DIAGNOSIS — I10 Essential (primary) hypertension: Secondary | ICD-10-CM

## 2012-07-17 LAB — POCT PREGNANCY, URINE: Preg Test, Ur: NEGATIVE

## 2012-07-17 MED ORDER — HYDROCHLOROTHIAZIDE 25 MG PO TABS: 25.0000 mg | ORAL_TABLET | Freq: Every day | ORAL | Status: DC

## 2012-07-17 NOTE — ED Notes (Signed)
Also reports she recently had a miscarriage

## 2012-07-17 NOTE — ED Provider Notes (Signed)
Debra Strong is a 27 y.o. female who presents to Urgent Care today for hypertension. Patient has a past medical history significant for hypertension previously on hydrochlorothiazide. She has been out of her medications for several months. She notes that she is incredibly stressed out currently as she recently had a miscarriage and has started a new job and is moving. Currently she feels well with no chest pain or current shortness of breath headache or vision loss. She would like to restart her blood pressure medications today. As previously noted she had a miscarriage in May. She is not currently on any contraception.    PMH reviewed. Hypertension, asthma History  Substance Use Topics  . Smoking status: Never Smoker   . Smokeless tobacco: Not on file  . Alcohol Use: No   ROS as above Medications reviewed. No current facility-administered medications for this encounter.   Current Outpatient Prescriptions  Medication Sig Dispense Refill  . albuterol (PROVENTIL HFA;VENTOLIN HFA) 108 (90 BASE) MCG/ACT inhaler Inhale 2 puffs into the lungs every 4 (four) hours as needed for wheezing or shortness of breath (cough).      . hydrochlorothiazide (HYDRODIURIL) 25 MG tablet Take 1 tablet (25 mg total) by mouth daily.  30 tablet  2    Exam:  BP 155/99  Pulse 85  Temp(Src) 97.7 F (36.5 C) (Oral)  Resp 19  SpO2 98%  LMP 07/17/2012  Breastfeeding? Unknown Gen: Well NAD HEENT: EOMI,  MMM Lungs: CTABL Nl WOB Heart: RRR no MRG Exts: Non edematous BL  LE, warm and well perfused.   Results for orders placed during the hospital encounter of 07/17/12 (from the past 24 hour(s))  POCT PREGNANCY, URINE     Status: None   Collection Time    07/17/12  8:54 AM      Result Value Range   Preg Test, Ur NEGATIVE  NEGATIVE   No results found.  Assessment and Plan: 27 y.o. female with hypertension.  Plan to start hydrochlorothiazide 25 mg daily. Additionally have provided the patient  information for the orange card. She will followup either here or any primary care office in the next several months for blood pressure checkup. She may likely need to blood pressure medications. Would choose amlodipine next.  Discussed warning signs or symptoms. Please see discharge instructions. Patient expresses understanding.      Rodolph Bong, MD 07/17/12 [phone]

## 2012-07-17 NOTE — ED Notes (Signed)
Pt c/o needing refills on BP meds... Last time she had BP meds was back in March... Denies: CP, SOB, blurry vision, edema... Will occasionally have lightheadedness and will feel nauseas... Also needing meds for her asthma; reports she had a flare up yest and used her nieces nebulizer... She is alert w/no signs of acute distress.

## 2012-07-19 NOTE — ED Provider Notes (Signed)
Medical screening examination/treatment/procedure(s) were performed by resident physician or non-physician practitioner and as supervising physician I was immediately available for consultation/collaboration.   KINDL,JAMES DOUGLAS MD.   James D Kindl, MD 07/19/12 0916 

## 2012-08-25 ENCOUNTER — Emergency Department (HOSPITAL_COMMUNITY)
Admission: EM | Admit: 2012-08-25 | Discharge: 2012-08-25 | Disposition: A | Discharge status: Home / Self Care | Payer: Self-pay | Attending: Emergency Medicine | Admitting: Emergency Medicine

## 2012-08-25 ENCOUNTER — Encounter (HOSPITAL_COMMUNITY): Payer: Self-pay | Admitting: Cardiology

## 2012-08-25 ENCOUNTER — Emergency Department (HOSPITAL_COMMUNITY): Payer: Self-pay

## 2012-08-25 DIAGNOSIS — I10 Essential (primary) hypertension: Secondary | ICD-10-CM | POA: Insufficient documentation

## 2012-08-25 DIAGNOSIS — Z79899 Other long term (current) drug therapy: Secondary | ICD-10-CM | POA: Insufficient documentation

## 2012-08-25 DIAGNOSIS — J45901 Unspecified asthma with (acute) exacerbation: Secondary | ICD-10-CM | POA: Insufficient documentation

## 2012-08-25 DIAGNOSIS — R059 Cough, unspecified: Secondary | ICD-10-CM | POA: Insufficient documentation

## 2012-08-25 DIAGNOSIS — R05 Cough: Secondary | ICD-10-CM | POA: Insufficient documentation

## 2012-08-25 MED ORDER — ALBUTEROL SULFATE HFA 108 (90 BASE) MCG/ACT IN AERS
2.0000 | INHALATION_SPRAY | RESPIRATORY_TRACT | Status: DC | PRN
  Administered 2012-08-25: 2 via RESPIRATORY_TRACT
  Filled 2012-08-25: qty 6.7

## 2012-08-25 MED ORDER — ALBUTEROL SULFATE (5 MG/ML) 0.5% IN NEBU
5.0000 mg | INHALATION_SOLUTION | Freq: Once | RESPIRATORY_TRACT | Status: AC
  Administered 2012-08-25: 5 mg via RESPIRATORY_TRACT
  Filled 2012-08-25: qty 1

## 2012-08-25 NOTE — ED Notes (Signed)
NP at bedside.

## 2012-08-25 NOTE — ED Provider Notes (Signed)
CSN: [id number]     Arrival date & time 08/25/12  1545 History     First MD Initiated Contact with Patient 08/25/12 1914     Chief Complaint  Patient presents with  . Shortness of Breath   (Consider location/radiation/quality/duration/timing/severity/associated sxs/prior Treatment) Patient is a 27 y.o. female presenting with shortness of breath. The history is provided by the patient. No language interpreter was used.  Shortness of Breath Severity:  Mild Onset quality:  Sudden Timing:  Sporadic Progression:  Improving Chronicity:  Recurrent Context: weather changes   Ineffective treatments:  None tried Associated symptoms: cough     Past Medical History  Diagnosis Date  . Asthma   . Hypertension   . Pregnancy induced hypertension    History reviewed. No pertinent past surgical history. Family History  Problem Relation Age of Onset  . Hypertension Mother   . Diabetes Father   . Hypertension Father    History  Substance Use Topics  . Smoking status: Never Smoker   . Smokeless tobacco: Not on file  . Alcohol Use: No   OB History   Grav Para Term Preterm Abortions TAB SAB Ect Mult Living   4 1  1 2 1 1         Review of Systems  Respiratory: Positive for cough and shortness of breath.   All other systems reviewed and are negative.    Allergies  Shellfish allergy  Home Medications   Current Outpatient Rx  Name  Route  Sig  Dispense  Refill  . albuterol (PROVENTIL HFA;VENTOLIN HFA) 108 (90 BASE) MCG/ACT inhaler   Inhalation   Inhale 2 puffs into the lungs every 4 (four) hours as needed for wheezing or shortness of breath (cough).         . hydrochlorothiazide (HYDRODIURIL) 25 MG tablet   Oral   Take 1 tablet (25 mg total) by mouth daily.   30 tablet   2   . Multiple Vitamin (MULTIVITAMIN WITH MINERALS) TABS tablet   Oral   Take 1 tablet by mouth daily.          BP 138/93  Pulse 86  Temp(Src) 98.3 F (36.8 C) (Oral)  Resp 18  Ht 5\' 2"  (1.575  m)  Wt 197 lb (89.359 kg)  BMI 36.02 kg/m2  SpO2 100%  LMP 07/30/2012 Physical Exam  Nursing note and vitals reviewed. Constitutional: She is oriented to person, place, and time. She appears well-developed and well-nourished. No distress.  HENT:  Head: Normocephalic.  Eyes: Pupils are equal, round, and reactive to light.  Neck: Normal range of motion.  Cardiovascular: Normal rate, regular rhythm and normal heart sounds.   Pulmonary/Chest: Effort normal and breath sounds normal.  Abdominal: Soft. Bowel sounds are normal.  Musculoskeletal: Normal range of motion.  Neurological: She is alert and oriented to person, place, and time.  Skin: Skin is warm and dry.  Psychiatric: She has a normal mood and affect. Her behavior is normal. Judgment and thought content normal.    ED Course   Procedures (including critical care time)  Labs Reviewed - No data to display Dg Chest 2 View (if Patient Has Fever And/or Copd)  08/25/2012   *RADIOLOGY REPORT*  Clinical Data: Shortness of breath and wheezing  CHEST - 2 VIEW  Comparison: January 10, 2010.  Findings: Lungs clear.  Heart size and pulmonary vascularity are normal.  No adenopathy.  No bone lesions.  IMPRESSION: No abnormality noted.   Original Report Authenticated By: Chrissie Noa  Margarita Grizzle, M.D.   No diagnosis found. Patient with exacerbation of asthma upon awakening this morning.  Steady improvement throughout the day, but patient is out of her inhaler.  Patient has completed a nebulizer treatment prior to my assessment, lungs clear to auscultation with good air movement, no wheezing. MDM    Asthma exacerbation, mild. Chest xray normal.  Mild, non-productive cough.  No fever.  Patient has an appointment with [facility] in two weeks.   Jimmye Norman, NP 08/25/12 [phone]

## 2012-08-25 NOTE — ED Notes (Signed)
bilat breath sounds clear.  Respirations easy. Pt states she feels much improved, feels she is ready to leave.

## 2012-08-25 NOTE — ED Notes (Signed)
Pt reports that she has had some SOB and has hx of asthma. States that the symptoms began last night after she got back from doing her laundry. Speaking in complete sentences. No distress noted.

## 2012-08-26 NOTE — ED Provider Notes (Signed)
Medical screening examination/treatment/procedure(s) were conducted as a shared visit with non-physician practitioner(s) or resident  and myself.  I personally evaluated the patient during the encounter and agree with the findings and plan unless otherwise indicated.    Mild sob.  Similar to previous.  Lungs few wheeze, exp.  No distress, well appearing.   Asthma exac mild.  Fup discussed.  DC   Enid Skeens, MD 08/26/12 1204

## 2012-09-10 ENCOUNTER — Ambulatory Visit (INDEPENDENT_AMBULATORY_CARE_PROVIDER_SITE_OTHER): Payer: Self-pay | Admitting: Family Medicine

## 2012-09-10 ENCOUNTER — Encounter: Payer: Self-pay | Admitting: Family Medicine

## 2012-09-10 VITALS — BP 133/76 | HR 105 | Temp 97.7°F | Wt 194.0 lb

## 2012-09-10 DIAGNOSIS — I10 Essential (primary) hypertension: Secondary | ICD-10-CM

## 2012-09-10 DIAGNOSIS — J45909 Unspecified asthma, uncomplicated: Secondary | ICD-10-CM

## 2012-09-10 MED ORDER — HYDROCHLOROTHIAZIDE 25 MG PO TABS: 25.0000 mg | ORAL_TABLET | Freq: Every day | ORAL | Status: DC

## 2012-09-10 MED ORDER — ALBUTEROL SULFATE HFA 108 (90 BASE) MCG/ACT IN AERS: 2.0000 | INHALATION_SPRAY | RESPIRATORY_TRACT | Status: DC | PRN

## 2012-09-10 NOTE — Assessment & Plan Note (Signed)
CUrrently asymptomatic. Peak flow of 350L/Min which is adequate. I refilled her Albuterol.

## 2012-09-10 NOTE — Progress Notes (Signed)
Subjective:     Patient ID: Debra Strong, female   DOB: 14-Nov-1985, 27 y.o.   MRN: [id number]  Hypertension This is a chronic problem. The current episode started more than 1 year ago. The problem has been rapidly improving since onset. The problem is controlled. Pertinent negatives include no anxiety, blurred vision, chest pain, headaches, neck pain, peripheral edema or shortness of breath. Risk factors for coronary artery disease include obesity. Past treatments include diuretics (Need HCTZ 25 mg refill.). The current treatment provides significant improvement. There are no compliance problems.   Asthma There is no chest tightness, cough, difficulty breathing, shortness of breath, sputum production or wheezing. Primary symptoms comments: She had wheezing 2 days ago for which she used her albuterol with good relieve. This is a chronic problem. The current episode started more than 1 year ago. Pertinent negatives include no chest pain or headaches. Her past medical history is significant for asthma.   Current Outpatient Prescriptions on File Prior to Visit  Medication Sig Dispense Refill  . Multiple Vitamin (MULTIVITAMIN WITH MINERALS) TABS tablet Take 1 tablet by mouth daily.       No current facility-administered medications on file prior to visit.   Past Medical History  Diagnosis Date  . Asthma   . Hypertension   . Pregnancy induced hypertension      Review of Systems  HENT: Negative for neck pain.   Eyes: Negative for blurred vision.  Respiratory: Negative for cough, sputum production, shortness of breath and wheezing.   Cardiovascular: Negative for chest pain.  Neurological: Negative for headaches.  All other systems reviewed and are negative.   Filed Vitals:   09/10/12 1614  BP: 133/76  Pulse: 105  Temp: 97.7 F (36.5 C)  TempSrc: Oral  Weight: 194 lb (87.998 kg)  PF: 350 L/min       Objective:   Physical Exam  Nursing note and vitals  reviewed. Constitutional: She appears well-developed. No distress.  Cardiovascular: Normal rate, regular rhythm, normal heart sounds and intact distal pulses.   No murmur heard. Pulmonary/Chest: Effort normal and breath sounds normal. No respiratory distress. She has no wheezes.  Abdominal: Soft. Bowel sounds are normal. She exhibits no distension and no mass. There is no tenderness.  Musculoskeletal: Normal range of motion. She exhibits no edema.       Assessment/Plan Asthma:     HTN Asthma

## 2012-09-10 NOTE — Patient Instructions (Signed)

## 2012-09-10 NOTE — Assessment & Plan Note (Signed)
BP is optimal. I refilled her HCTZ 25 mg qd. RTC in 3 wks for BMP,FLP.

## 2012-10-13 ENCOUNTER — Ambulatory Visit: Payer: Self-pay | Admitting: Family Medicine

## 2012-10-27 ENCOUNTER — Ambulatory Visit: Payer: Self-pay | Admitting: Family Medicine

## 2013-01-23 ENCOUNTER — Other Ambulatory Visit: Payer: Self-pay | Admitting: Family Medicine

## 2013-01-25 NOTE — Telephone Encounter (Signed)
Please have patient come in to see her PCP for subsequent refill. Please schedule follow up appointment for patient.

## 2013-06-07 ENCOUNTER — Other Ambulatory Visit: Payer: Self-pay | Admitting: Family Medicine

## 2013-06-07 NOTE — Telephone Encounter (Signed)
Please send refill to her PCP, it shows no record of her PCP.

## 2013-06-15 ENCOUNTER — Ambulatory Visit: Payer: No Typology Code available for payment source

## 2013-06-22 ENCOUNTER — Encounter: Payer: Self-pay | Admitting: Family Medicine

## 2013-06-22 ENCOUNTER — Ambulatory Visit (INDEPENDENT_AMBULATORY_CARE_PROVIDER_SITE_OTHER): Payer: No Typology Code available for payment source | Admitting: Family Medicine

## 2013-06-22 VITALS — BP 131/83 | HR 85 | Temp 97.7°F | Wt 200.0 lb

## 2013-06-22 DIAGNOSIS — I10 Essential (primary) hypertension: Secondary | ICD-10-CM

## 2013-06-22 DIAGNOSIS — H6120 Impacted cerumen, unspecified ear: Secondary | ICD-10-CM | POA: Insufficient documentation

## 2013-06-22 DIAGNOSIS — H9201 Otalgia, right ear: Secondary | ICD-10-CM

## 2013-06-22 DIAGNOSIS — H9209 Otalgia, unspecified ear: Secondary | ICD-10-CM

## 2013-06-22 LAB — BASIC METABOLIC PANEL
BUN: 11 mg/dL (ref 6–23)
CHLORIDE: 106 meq/L (ref 96–112)
CO2: 26 mEq/L (ref 19–32)
Calcium: 9 mg/dL (ref 8.4–10.5)
Creat: 0.94 mg/dL (ref 0.50–1.10)
GLUCOSE: 90 mg/dL (ref 70–99)
POTASSIUM: 3.9 meq/L (ref 3.5–5.3)
Sodium: 143 mEq/L (ref 135–145)

## 2013-06-22 LAB — LIPID PANEL
CHOL/HDL RATIO: 3.5 ratio
Cholesterol: 104 mg/dL (ref 0–200)
HDL: 30 mg/dL — ABNORMAL LOW (ref >39)
LDL CALC: 59 mg/dL (ref 0–99)
Triglycerides: 74 mg/dL (ref <150)
VLDL: 15 mg/dL (ref 0–40)

## 2013-06-22 MED ORDER — ALBUTEROL SULFATE HFA 108 (90 BASE) MCG/ACT IN AERS: 2.0000 | INHALATION_SPRAY | RESPIRATORY_TRACT | Status: DC | PRN

## 2013-06-22 MED ORDER — HYDROCHLOROTHIAZIDE 25 MG PO TABS: 25.0000 mg | ORAL_TABLET | Freq: Every day | ORAL | Status: DC

## 2013-06-22 NOTE — Assessment & Plan Note (Signed)
After cerumen disimpaction, I was able to reassess her ear. No erythema of her right ear. Likely viral infection. Patient reassured A/B is not required. NSAID prn pain,

## 2013-06-22 NOTE — Patient Instructions (Signed)
I just refilled your HCTZ, please stop taking the magnesium and check your BP regularly at least once a day till your next visit to see me. If BP is less than 100/60, please hold your HCTZ.    Otalgia The most common reason for this in children is an infection of the middle ear. Pain from the middle ear is usually caused by a build-up of fluid and pressure behind the eardrum. Pain from an earache can be sharp, dull, or burning. The pain may be temporary or constant. The middle ear is connected to the nasal passages by a short narrow tube called the Eustachian tube. The Eustachian tube allows fluid to drain out of the middle ear, and helps keep the pressure in your ear equalized. CAUSES  A cold or allergy can block the Eustachian tube with inflammation and the build-up of secretions. This is especially likely in small children, because their Eustachian tube is shorter and more horizontal. When the Eustachian tube closes, the normal flow of fluid from the middle ear is stopped. Fluid can accumulate and cause stuffiness, pain, hearing loss, and an ear infection if germs start growing in this area. SYMPTOMS  The symptoms of an ear infection may include fever, ear pain, fussiness, increased crying, and irritability. Many children will have temporary and minor hearing loss during and right after an ear infection. Permanent hearing loss is rare, but the risk increases the more infections a child has. Other causes of ear pain include retained water in the outer ear canal from swimming and bathing. Ear pain in adults is less likely to be from an ear infection. Ear pain may be referred from other locations. Referred pain may be from the joint between your jaw and the skull. It may also come from a tooth problem or problems in the neck. Other causes of ear pain include:  A foreign body in the ear.  Outer ear infection.  Sinus infections.  Impacted ear wax.  Ear injury.  Arthritis of the jaw or TMJ  problems.  Middle ear infection.  Tooth infections.  Sore throat with pain to the ears. DIAGNOSIS  Your caregiver can usually make the diagnosis by examining you. Sometimes other special studies, including x-rays and lab work may be necessary. TREATMENT   If antibiotics were prescribed, use them as directed and finish them even if you or your child's symptoms seem to be improved.  Sometimes PE tubes are needed in children. These are little plastic tubes which are put into the eardrum during a simple surgical procedure. They allow fluid to drain easier and allow the pressure in the middle ear to equalize. This helps relieve the ear pain caused by pressure changes. HOME CARE INSTRUCTIONS   Only take over-the-counter or prescription medicines for pain, discomfort, or fever as directed by your caregiver. DO NOT GIVE CHILDREN ASPIRIN because of the association of Reye's Syndrome in children taking aspirin.  Use a cold pack applied to the outer ear for 15-20 minutes, 03-04 times per day or as needed may reduce pain. Do not apply ice directly to the skin. You may cause frost bite.  Over-the-counter ear drops used as directed may be effective. Your caregiver may sometimes prescribe ear drops.  Resting in an upright position may help reduce pressure in the middle ear and relieve pain.  Ear pain caused by rapidly descending from high altitudes can be relieved by swallowing or chewing gum. Allowing infants to suck on a bottle during airplane travel can help.  Do not smoke in the house or near children. If you are unable to quit smoking, smoke outside.  Control allergies. SEEK IMMEDIATE MEDICAL CARE IF:   You or your child are becoming sicker.  Pain or fever relief is not obtained with medicine.  You or your child's symptoms (pain, fever, or irritability) do not improve within 24 to 48 hours or as instructed.  Severe pain suddenly stops hurting. This may indicate a ruptured eardrum.  You  or your children develop new problems such as severe headaches, stiff neck, difficulty swallowing, or swelling of the face or around the ear. Document Released: 08/11/2003 Document Revised: 03/18/2011 Document Reviewed: 12/16/2007 [employer] Patient Information 2014 [employer], [place].

## 2013-06-22 NOTE — Assessment & Plan Note (Signed)
Right Ear lavage done, large amount of thick well formed wax removed. Patient was able to hear better after lavage. Ear canal normal with no erythema,no bleeding or inflammation. Right TM normal.

## 2013-06-22 NOTE — Assessment & Plan Note (Signed)
Patient instructed to d/c magnesium otc. I refilled her HCTZ Patient instructed to check BP daily and if less than 100/60 to hold her medication. F/U in 1-2 wks for reassessment.

## 2013-06-22 NOTE — Progress Notes (Signed)
Subjective:     Patient ID: Debra Strong, female   DOB: 02-27-1985, 28 y.o.   MRN: [id number]  HPI JYN:[id number]:Here to refill her medication,she ran out of her medicine few weeks ago, since then her BP was very high with worsening leg swelling. She started using OTC magnesium which help with her BP a lot. She denies any other concern today. Ear pain: Started as allergy reaction with itchy right ear,the next day she started hurting,swollen, she does not cough, no fever, no sick contact but at work. She has pain in her throat as well. No medication used so far.No ear discharge, feels like she hears muffle sound on her right ear. Muffled hearing: On her right. Asthma: Denies any problem currently, no cough,no chest tightness or wheezing, at times while in her car due to lack of A/C she feels congested. She need refill of her Albuterol.  Current Outpatient Prescriptions on File Prior to Visit  Medication Sig Dispense Refill  . Multiple Vitamin (MULTIVITAMIN WITH MINERALS) TABS tablet Take 1 tablet by mouth daily.       No current facility-administered medications on file prior to visit.   Past Medical History  Diagnosis Date  . Asthma   . Hypertension   . Pregnancy induced hypertension      Review of Systems  HENT: Positive for ear pain. Negative for ear discharge and facial swelling.        Muffled hearing on her right ear.  Respiratory: Negative.   Cardiovascular: Negative.   Gastrointestinal: Negative.   Genitourinary: Negative.   Neurological: Negative.   All other systems reviewed and are negative.  Filed Vitals:   06/22/13 0954  BP: 131/83  Pulse: 85  Temp: 97.7 F (36.5 C)  Weight: 200 lb (90.719 kg)       Objective:   Physical Exam  Nursing note and vitals reviewed. Constitutional: She appears well-developed. No distress.  HENT:  Head: Normocephalic.  Right Ear: External ear and ear canal normal. No drainage or swelling.  Left Ear: Tympanic membrane, external ear  and ear canal normal. No drainage or tenderness.  Mouth/Throat: Uvula is midline, oropharynx is clear and moist and mucous membranes are normal.  Impacted wax on right  Eyes: Conjunctivae are normal.  Neck: Neck supple.  Cardiovascular: Normal rate, regular rhythm, normal heart sounds and intact distal pulses.   No murmur heard. Pulmonary/Chest: Effort normal. She has wheezes.  Faint expiratory wheeze globally.  Abdominal: Soft. Bowel sounds are normal. She exhibits no distension and no mass. There is no tenderness.  Musculoskeletal: Normal range of motion.  Lymphadenopathy:    She has no cervical adenopathy.  Neurological: She is alert.       Assessment:     HTN Otalgia Cerumen impaction     Plan:     Check problem list

## 2013-06-23 ENCOUNTER — Telehealth: Payer: Self-pay | Admitting: Family Medicine

## 2013-06-23 NOTE — Telephone Encounter (Signed)
I called to discuss her lab result. All questions answered.

## 2013-07-06 ENCOUNTER — Ambulatory Visit: Payer: Medicaid Other | Admitting: Family Medicine

## 2013-07-16 ENCOUNTER — Ambulatory Visit (INDEPENDENT_AMBULATORY_CARE_PROVIDER_SITE_OTHER): Payer: No Typology Code available for payment source | Admitting: Family Medicine

## 2013-07-16 ENCOUNTER — Encounter: Payer: Self-pay | Admitting: Family Medicine

## 2013-07-16 VITALS — BP 122/87 | HR 98 | Temp 98.1°F | Ht 61.0 in | Wt 194.7 lb

## 2013-07-16 DIAGNOSIS — I10 Essential (primary) hypertension: Secondary | ICD-10-CM

## 2013-07-16 MED ORDER — AMLODIPINE BESYLATE 5 MG PO TABS: 5.0000 mg | ORAL_TABLET | Freq: Every day | ORAL | Status: DC

## 2013-07-16 NOTE — Progress Notes (Signed)
   Subjective:    Patient ID: Debra Strong, female    DOB: May 28, 1985, 28 y.o.   MRN: [id number]  HPI  Debra Strong is here for bp and near syncope.   HTN Disease Monitoring:none Home BP Monitoring 130/90 Chest pain- no    Dyspnea- no Medications:HCTZ 25 mg daily  Compliance-  Yes . Lightheadedness-  no  Edema- no  She has been having near syncopal episodes since starting the hydrochlorothiazide about a week and a half ago. These have been coinciding with the [employer] [facility] being out at her work. She also feels these symptoms when she is driving around after running errands. She has not lost consciousness and has no history of losing consciousness. She reports drinking 4-5 glasses of water daily. She does not drink soda or sweet tea. She has no history of any heart abnormalities. Laying down for 20 minutes makes her feel better. She denies any fever or diarrhea. She has had some vomiting and nausea. These episodes have been occurring daily and the number of to times depends on how busy she gets.    Current Outpatient Prescriptions on File Prior to Visit  Medication Sig Dispense Refill  . albuterol (PROVENTIL HFA;VENTOLIN HFA) 108 (90 BASE) MCG/ACT inhaler Inhale 2 puffs into the lungs every 4 (four) hours as needed for wheezing or shortness of breath (cough).  18 g  3  . hydrochlorothiazide (HYDRODIURIL) 25 MG tablet Take 1 tablet (25 mg total) by mouth daily.  30 tablet  0  . Multiple Vitamin (MULTIVITAMIN WITH MINERALS) TABS tablet Take 1 tablet by mouth daily.       No current facility-administered medications on file prior to visit.    Review of Systems See HPI     Objective:   Physical Exam BP 122/87  Pulse 98  Temp(Src) 98.1 F (36.7 C) (Oral)  Ht 5\' 1"  (1.549 m)  Wt 194 lb 11.2 oz (88.315 kg)  BMI 36.81 kg/m2 Gen: NAD, alert, cooperative with exam, well-appearing CV: RRR, good S1/S2, no murmur, no edema, capillary refill brisk  Resp: CTABL, no wheezes,  non-labored Ext: no edema, CR brisk,        Assessment & Plan:

## 2013-07-16 NOTE — Assessment & Plan Note (Signed)
Intolerance to hydrochlorothiazide with near syncopal episodes. Blood pressure is controlled. We'll discontinue the hydrochlorothiazide based on her current symptoms. - Initiate amlodipine 5 mg daily - Discontinue hydrochlorothiazide - Follow up with Dr. Lum BabeEniola in 3-4 weeks - Discussed with Dr. Lum BabeEniola.

## 2013-07-16 NOTE — Patient Instructions (Signed)
Thank you for coming in,   I have sent in Norvasc. This is calcium channel blocker. It will not make you urinate like the HCTZ.   Please stop taking the HCTZ.   Please follow up with Dr. Lum BabeEniola in 3-4 weeks.    Please feel free to call with any questions or concerns at any time, at [phone]. --Dr. Jordan LikesSchmitz  Amlodipine tablets What is this medicine? AMLODIPINE (am LOE di peen) is a calcium-channel blocker. It affects the amount of calcium found in your heart and muscle cells. This relaxes your blood vessels, which can reduce the amount of work the heart has to do. This medicine is used to lower high blood pressure. It is also used to prevent chest pain. This medicine may be used for other purposes; ask your health care provider or pharmacist if you have questions. COMMON BRAND NAME(S): Norvasc What should I tell my health care provider before I take this medicine? They need to know if you have any of these conditions: -heart problems like heart failure or aortic stenosis -liver disease -an unusual or allergic reaction to amlodipine, other medicines, foods, dyes, or preservatives -pregnant or trying to get pregnant -breast-feeding How should I use this medicine? Take this medicine by mouth with a glass of water. Follow the directions on the prescription label. Take your medicine at regular intervals. Do not take more medicine than directed. Talk to your pediatrician regarding the use of this medicine in children. Special care may be needed. This medicine has been used in children as young as 6. Persons over 29 years old may have a stronger reaction to this medicine and need smaller doses. Overdosage: If you think you have taken too much of this medicine contact a poison control center or emergency room at once. NOTE: This medicine is only for you. Do not share this medicine with others. What if I miss a dose? If you miss a dose, take it as soon as you can. If it is almost time for your  next dose, take only that dose. Do not take double or extra doses. What may interact with this medicine? -herbal or dietary supplements -local or general anesthetics -medicines for high blood pressure -medicines for prostate problems -rifampin This list may not describe all possible interactions. Give your health care provider a list of all the medicines, herbs, non-prescription drugs, or dietary supplements you use. Also tell them if you smoke, drink alcohol, or use illegal drugs. Some items may interact with your medicine. What should I watch for while using this medicine? Visit your doctor or health care professional for regular check ups. Check your blood pressure and pulse rate regularly. Ask your health care professional what your blood pressure and pulse rate should be, and when you should contact him or her. This medicine may make you feel confused, dizzy or lightheaded. Do not drive, use machinery, or do anything that needs mental alertness until you know how this medicine affects you. To reduce the risk of dizzy or fainting spells, do not sit or stand up quickly, especially if you are an older patient. Avoid alcoholic drinks; they can make you more dizzy. Do not suddenly stop taking amlodipine. Ask your doctor or health care professional how you can gradually reduce the dose. What side effects may I notice from receiving this medicine? Side effects that you should report to your doctor or health care professional as soon as possible: -allergic reactions like skin rash, itching or hives, swelling of the  face, lips, or tongue -breathing problems -changes in vision or hearing -chest pain -fast, irregular heartbeat -swelling of legs or ankles Side effects that usually do not require medical attention (report to your doctor or health care professional if they continue or are bothersome): -dry mouth -facial flushing -nausea, vomiting -stomach gas, pain -tired, weak -trouble  sleeping This list may not describe all possible side effects. Call your doctor for medical advice about side effects. You may report side effects to FDA at 1-800-FDA-1088. Where should I keep my medicine? Keep out of the reach of children. Store at room temperature between 59 and 86 degrees F (15 and 30 degrees C). Protect from light. Keep container tightly closed. Throw away any unused medicine after the expiration date. NOTE: This sheet is a summary. It may not cover all possible information. If you have questions about this medicine, talk to your doctor, pharmacist, or health care provider.  2015, Elsevier/Gold Standard. (2011-11-22 11:40:58)

## 2013-07-23 ENCOUNTER — Telehealth: Payer: Self-pay | Admitting: Family Medicine

## 2013-07-23 NOTE — Telephone Encounter (Signed)
Pt called and needs a copy of her BP check report from the 9th and 10th to her employer at [phone] attention Human Resources. jw

## 2013-07-26 NOTE — Telephone Encounter (Signed)
LMOVM asking for pt to return call.  We can not fax over parts of her medical record to her job without a signed ROI.  She can either come and sign form or come and pick up results. Debra Strong, Maryjo RochesterJessica Dawn

## 2013-11-08 ENCOUNTER — Encounter: Payer: Self-pay | Admitting: Family Medicine

## 2014-03-04 ENCOUNTER — Encounter: Payer: Self-pay | Admitting: Family Medicine

## 2014-03-04 ENCOUNTER — Ambulatory Visit (INDEPENDENT_AMBULATORY_CARE_PROVIDER_SITE_OTHER): Payer: 59 | Admitting: Family Medicine

## 2014-03-04 VITALS — BP 147/93 | HR 86 | Temp 98.3°F | Ht 61.0 in | Wt 190.0 lb

## 2014-03-04 DIAGNOSIS — I10 Essential (primary) hypertension: Secondary | ICD-10-CM

## 2014-03-04 DIAGNOSIS — F4323 Adjustment disorder with mixed anxiety and depressed mood: Secondary | ICD-10-CM

## 2014-03-04 DIAGNOSIS — J4520 Mild intermittent asthma, uncomplicated: Secondary | ICD-10-CM

## 2014-03-04 MED ORDER — PAROXETINE HCL 20 MG PO TABS: 20.0000 mg | ORAL_TABLET | Freq: Every day | ORAL | Status: DC

## 2014-03-04 MED ORDER — AMLODIPINE BESYLATE 5 MG PO TABS: 5.0000 mg | ORAL_TABLET | Freq: Every day | ORAL | Status: DC

## 2014-03-04 MED ORDER — ALBUTEROL SULFATE HFA 108 (90 BASE) MCG/ACT IN AERS: 2.0000 | INHALATION_SPRAY | RESPIRATORY_TRACT | Status: DC | PRN

## 2014-03-04 NOTE — Assessment & Plan Note (Signed)
Currently asymptomatic. I refilled her albuterol.

## 2014-03-04 NOTE — Assessment & Plan Note (Signed)
Gradually worsening due to change in job schedule. I counseled about reducing her work hours. + self image, routine exercise. I recommended both pharmacotherapy and psychotherapy. She is started on Paxil. Instruction was given on how to schedule psych appointment for counseling, she will do this ASAP. F/U in 2-4 wks for reassessment or sooner if symptoms worsens. She agreed with plan.

## 2014-03-04 NOTE — Progress Notes (Signed)
Subjective:     Patient ID: Debra Strong, female   DOB: 08-11-1985, 29 y.o.   MRN: [id number]  HPI HTN: She has been out of her Norvasc for about 4 wks, BP has been running high, she will like refill of medication today. Emotion issue: Patient here for emotional issues that has worsened over the last 2-3 month. Her first problem started in 2014 when she broke up with the father of her son but she was able to cope well then. She currently work as a Engineer, materialscustomer service agent with night shift, she started work in 2014 sept,but her schedule was recently changed from 8pm to 7am 4 days a week working 10 hrs per shift. This is stressful since she does not have time to spend with her son. She gets home 7 am, get her son ready for school, also takes her sister to school once she gest back home from dropping son off, she does house chores and cook so her son can have something to eat. She sleeps less than 4 hrs before returning to work. She is especially concern since she had not been able to spend time with her son. Asthma: Patient has been out of medication, recently she has been having mild symptoms especially due to change in weather, she will like to get a refill of her medication.   Current Outpatient Prescriptions on File Prior to Visit  Medication Sig Dispense Refill  . Multiple Vitamin (MULTIVITAMIN WITH MINERALS) TABS tablet Take 1 tablet by mouth daily.     No current facility-administered medications on file prior to visit.   Past Medical History  Diagnosis Date  . Asthma   . Hypertension   . Pregnancy induced hypertension     Review of Systems  Respiratory: Negative.   Cardiovascular: Negative.   Gastrointestinal: Negative.   Psychiatric/Behavioral: Negative for suicidal ideas, hallucinations and self-injury. The patient is nervous/anxious.        Depressed  All other systems reviewed and are negative.  Filed Vitals:   03/04/14 0931  BP: 147/93  Pulse: 86  Temp: 98.3 F  (36.8 C)  TempSrc: Oral  Height: 5\' 1"  (1.549 m)  Weight: 190 lb (86.183 kg)       Objective:   Physical Exam  Constitutional: She is oriented to person, place, and time. She appears well-developed. No distress.  Cardiovascular: Normal rate, regular rhythm, normal heart sounds and intact distal pulses.   No murmur heard. Pulmonary/Chest: Effort normal and breath sounds normal. No respiratory distress. She has no wheezes.  Abdominal: Soft. Bowel sounds are normal. She exhibits no distension and no mass. There is no tenderness.  Musculoskeletal: Normal range of motion.  Neurological: She is alert and oriented to person, place, and time.  Psychiatric: Her speech is normal and behavior is normal. Judgment and thought content normal. Cognition and memory are normal. She exhibits a depressed mood. She expresses no homicidal and no suicidal ideation. She expresses no suicidal plans and no homicidal plans.  Very emotional, she cried throughout the visit intermittently.  Nursing note and vitals reviewed.      Assessment:     HTN:  Emotion issue Asthma    Plan:     Check problem list.      >30 min was spent on counseling and coordination of care for this visit.

## 2014-03-04 NOTE — Assessment & Plan Note (Signed)
BP slightly elevated today. I refilled her Norvasc.

## 2014-03-04 NOTE — Progress Notes (Signed)
Will place paperwork in MD box for review. Tad Fancher, CMA.

## 2014-03-04 NOTE — Patient Instructions (Signed)
It was nice seeing you today, I am sorry about your anxiety and depression, it seem this was triggered by stress which can be termed as adjustment disorder. Cutting back on your work hours might help but I will like for you to start medication and get counseling as well. Please call to schedule appoint with psychiatrist/psychologist from the list I gave you today. See me back in 4 wks please. Call soon if symptoms worsens.      Adjustment Disorder Most changes in life can cause stress. Getting used to changes may take a few months or longer. If feelings of stress, hopelessness, or worry continue, you may have an adjustment disorder. This stress-related mental health problem may affect your feelings, thinking and how you act. It occurs in both sexes and happens at any age. SYMPTOMS  Some of the following problems may be seen and vary from person to person:  Sadness or depression.  Loss of enjoyment.  Thoughts of suicide.  Fighting.  Avoiding family and friends.  Poor school performance.  Hopelessness, sense of loss.  Trouble sleeping.  Vandalism.  Worry, weight loss or gain.  Crying spells.  Anxiety  Reckless driving.  Skipping school.  Poor work International aid/development workerperformance.  Nervousness.  Ignoring bills.  Poor attitude. DIAGNOSIS  Your caregiver will ask what has happened in your life and do a physical exam. They will make a diagnosis of an adjustment disorder when they are sure another problem or medical illness causing your feelings does not exist. TREATMENT  When problems caused by stress interfere with you daily life or last longer than a few months, you may need counseling for an adjustment disorder. Early treatment may diminish problems and help you to better cope with the stressful events in your life. Sometimes medication is necessary. Individual counseling and or support groups can be very helpful. PROGNOSIS  Adjustment disorders usually last less than 3 to 6 months. The  condition may persist if there is long lasting stress. This could include health problems, relationship problems, or job difficulties where you can not easily escape from what is causing the problem. PREVENTION  Even the most mentally healthy, highly functioning people can suffer from an adjustment disorder given a significant blow from a life-changing event. There is no way to prevent pain and loss. Most people need help from time to time. You are not alone. SEEK MEDICAL CARE IF:  Your feelings or symptoms listed above do not improve or worsen. Document Released: 08/28/2005 Document Revised: 03/18/2011 Document Reviewed: 11/19/2006 [facility] Patient Information 2015 [employer], [place]. This information is not intended to replace advice given to you by your health care provider. Make sure you discuss any questions you have with your health care provider.

## 2014-03-04 NOTE — Progress Notes (Signed)
Pt needs form completed for medical leave by 3//09/2014. Please call pt when ready for pu @ [phone]. / Thanks sr

## 2014-03-07 NOTE — Progress Notes (Signed)
Pt informed that forms are complete and ready for pick up.  Forms copied for scanning in pt's record.  Clovis PuMartin, Dajuan Turnley L, RN

## 2014-03-07 NOTE — Progress Notes (Signed)
Form completed and placed in Debra Strong's in-box.

## 2014-04-12 ENCOUNTER — Encounter: Payer: Self-pay | Admitting: Family Medicine

## 2014-04-12 ENCOUNTER — Ambulatory Visit (INDEPENDENT_AMBULATORY_CARE_PROVIDER_SITE_OTHER): Payer: 59 | Admitting: Family Medicine

## 2014-04-12 VITALS — BP 141/85 | HR 83 | Temp 98.2°F | Ht 62.0 in | Wt 189.0 lb

## 2014-04-12 DIAGNOSIS — F4323 Adjustment disorder with mixed anxiety and depressed mood: Secondary | ICD-10-CM

## 2014-04-12 DIAGNOSIS — I10 Essential (primary) hypertension: Secondary | ICD-10-CM

## 2014-04-12 NOTE — Patient Instructions (Signed)
Adjustment Disorder °Most changes in life can cause stress. Getting used to changes may take a few months or longer. If feelings of stress, hopelessness, or worry continue, you may have an adjustment disorder. This stress-related mental health problem may affect your feelings, thinking and how you act. It occurs in both sexes and happens at any age. °SYMPTOMS  °Some of the following problems may be seen and vary from person to person: °· Sadness or depression. °· Loss of enjoyment. °· Thoughts of suicide. °· Fighting. °· Avoiding family and friends. °· Poor school performance. °· Hopelessness, sense of loss. °· Trouble sleeping. °· Vandalism. °· Worry, weight loss or gain. °· Crying spells. °· Anxiety °· Reckless driving. °· Skipping school. °· Poor work performance. °· Nervousness. °· Ignoring bills. °· Poor attitude. °DIAGNOSIS  °Your caregiver will ask what has happened in your life and do a physical exam. They will make a diagnosis of an adjustment disorder when they are sure another problem or medical illness causing your feelings does not exist. °TREATMENT  °When problems caused by stress interfere with you daily life or last longer than a few months, you may need counseling for an adjustment disorder. Early treatment may diminish problems and help you to better cope with the stressful events in your life. Sometimes medication is necessary. Individual counseling and or support groups can be very helpful. °PROGNOSIS  °Adjustment disorders usually last less than 3 to 6 months. The condition may persist if there is long lasting stress. This could include health problems, relationship problems, or job difficulties where you can not easily escape from what is causing the problem. °PREVENTION  °Even the most mentally healthy, highly functioning people can suffer from an adjustment disorder given a significant blow from a life-changing event. There is no way to prevent pain and loss. Most people need help from time  to time. You are not alone. °SEEK MEDICAL CARE IF:  °Your feelings or symptoms listed above do not improve or worsen. °Document Released: 08/28/2005 Document Revised: 03/18/2011 Document Reviewed: 11/19/2006 °ExitCare® Patient Information ©2015 [employer]. This information is not intended to replace advice given to you by your health care provider. Make sure you discuss any questions you have with your health care provider. ° °

## 2014-04-12 NOTE — Assessment & Plan Note (Signed)
Compliant with medication but off this morning. She may continue current dose of Norvasc. Continue home BP monitoring.

## 2014-04-12 NOTE — Assessment & Plan Note (Signed)
Patient doing well off Paxil. Her mood is way much better today. I agree with her she might be able to get back to work with reduced hours. I gave her letter to return to work for 6-8 hrs shift instead of the 10 hours she normally does. F/U soon if symptoms returns.

## 2014-04-12 NOTE — Progress Notes (Signed)
Subjective:     Patient ID: Debra Strong, female   DOB: 1985-10-10, 29 y.o.   MRN: [id number]  HPI  HTN: here for follow up, she is compliant with her Norvasc but yet to take it this morning. Adjustment disorder:Patient self taper off Paxil, she is now feeling better with her mood after taking some time off work, she spent more time with her son, she feels happy, denies feeling of depression or anxiety, not suicidal.  Current Outpatient Prescriptions on File Prior to Visit  Medication Sig Dispense Refill  . albuterol (PROVENTIL HFA;VENTOLIN HFA) 108 (90 BASE) MCG/ACT inhaler Inhale 2 puffs into the lungs every 4 (four) hours as needed for wheezing or shortness of breath (cough). 18 g 3  . amLODipine (NORVASC) 5 MG tablet Take 1 tablet (5 mg total) by mouth daily. 90 tablet 3  . Multiple Vitamin (MULTIVITAMIN WITH MINERALS) TABS tablet Take 1 tablet by mouth daily.    Marland Kitchen. PARoxetine (PAXIL) 20 MG tablet Take 1 tablet (20 mg total) by mouth daily. (Patient not taking: Reported on 04/12/2014) 30 tablet 2   No current facility-administered medications on file prior to visit.   Past Medical History  Diagnosis Date  . Asthma   . Hypertension   . Pregnancy induced hypertension       Review of Systems  Respiratory: Negative.   Cardiovascular: Negative.   Gastrointestinal: Negative.   Neurological: Negative.   Psychiatric/Behavioral: Negative.   All other systems reviewed and are negative.  Filed Vitals:   04/12/14 0939  BP: 141/85  Pulse: 83  Temp: 98.2 F (36.8 C)  TempSrc: Oral  Height: 5\' 2"  (1.575 m)  Weight: 189 lb (85.73 kg)       Objective:   Physical Exam  Constitutional: She is oriented to person, place, and time. She appears well-developed. No distress.  Cardiovascular: Normal rate, regular rhythm and normal heart sounds.   No murmur heard. Pulmonary/Chest: Effort normal and breath sounds normal. No respiratory distress. She has no wheezes.  Abdominal: Soft.  Bowel sounds are normal. She exhibits no distension. There is no tenderness.  Neurological: She is alert and oriented to person, place, and time.  Psychiatric: She has a normal mood and affect. Her behavior is normal. Judgment and thought content normal.  Nursing note and vitals reviewed.      Assessment:     HTN:  Adjustment disorder:    Plan:     Check problem list.

## 2014-04-20 ENCOUNTER — Telehealth: Payer: Self-pay | Admitting: Family Medicine

## 2014-04-20 NOTE — Telephone Encounter (Signed)
Pt advised as directed below and verbalized understanding. Andrey Hoobler, CMA. 

## 2014-04-20 NOTE — Telephone Encounter (Signed)
Spoke with pt and she stated that since returning to work she experiences her palms and feet sweating first, SOB while sitting (randomly occurs). She also stated that she has heart palpitations that last for or so and has mild HA's as well. She stated that she has to take a break and goes outside for about and calms herself down but maybe 30 mins later sx return again all throughout work. Please advise. Rickell Wiehe, CMA.

## 2014-04-20 NOTE — Telephone Encounter (Signed)
This sounds like symptoms of anxiety, it could be that she went back to work too soon. Advise her to get back on Paxil, if this does not help she might need stay out of work as before and to see me soon.

## 2014-04-20 NOTE — Telephone Encounter (Signed)
Pt called and would like to speak to Dr. Lum BabeEniola about the symptoms she is having since returning back to work. jw

## 2014-06-14 ENCOUNTER — Encounter (HOSPITAL_COMMUNITY): Payer: Self-pay | Admitting: *Deleted

## 2014-06-14 ENCOUNTER — Inpatient Hospital Stay (HOSPITAL_COMMUNITY)
Admission: AD | Admit: 2014-06-14 | Discharge: 2014-06-15 | Disposition: A | Discharge status: Home / Self Care | Payer: No Typology Code available for payment source | Source: Ambulatory Visit | Attending: Obstetrics & Gynecology | Admitting: Obstetrics & Gynecology

## 2014-06-14 DIAGNOSIS — N898 Other specified noninflammatory disorders of vagina: Secondary | ICD-10-CM

## 2014-06-14 DIAGNOSIS — A599 Trichomoniasis, unspecified: Secondary | ICD-10-CM | POA: Insufficient documentation

## 2014-06-14 DIAGNOSIS — F17210 Nicotine dependence, cigarettes, uncomplicated: Secondary | ICD-10-CM | POA: Insufficient documentation

## 2014-06-14 NOTE — MAU Note (Signed)
PT SAYS SHE HAD SEX-  2 WEEKS  AGO -  THEN SHE DEVELOPED  CLEAR D/C -  THEN   HAD  ITCHING  AND  ODOR-  AND  NOW  WORSE.    NO BIRTH  CONTROL.  [facility]  [facility]  FOR  PAP SMEAR  ON   July

## 2014-06-15 DIAGNOSIS — N898 Other specified noninflammatory disorders of vagina: Secondary | ICD-10-CM

## 2014-06-15 LAB — WET PREP, GENITAL: Yeast Wet Prep HPF POC: NONE SEEN

## 2014-06-15 LAB — URINE MICROSCOPIC-ADD ON

## 2014-06-15 LAB — URINALYSIS, ROUTINE W REFLEX MICROSCOPIC
BILIRUBIN URINE: NEGATIVE
Glucose, UA: NEGATIVE mg/dL
Ketones, ur: NEGATIVE mg/dL
NITRITE: NEGATIVE
PROTEIN: NEGATIVE mg/dL
Specific Gravity, Urine: 1.015 (ref 1.005–1.030)
Urobilinogen, UA: 0.2 mg/dL (ref 0.0–1.0)
pH: 6 (ref 5.0–8.0)

## 2014-06-15 LAB — HIV ANTIBODY (ROUTINE TESTING W REFLEX): HIV SCREEN 4TH GENERATION: NONREACTIVE

## 2014-06-15 LAB — GC/CHLAMYDIA PROBE AMP (~~LOC~~) NOT AT ARMC
Chlamydia: POSITIVE — AB
NEISSERIA GONORRHEA: NEGATIVE

## 2014-06-15 LAB — RPR: RPR Ser Ql: NONREACTIVE

## 2014-06-15 LAB — POCT PREGNANCY, URINE: Preg Test, Ur: NEGATIVE

## 2014-06-15 MED ORDER — METRONIDAZOLE 500 MG PO TABS: 2000.0000 mg | ORAL_TABLET | Freq: Once | ORAL | Status: DC

## 2014-06-15 NOTE — Discharge Instructions (Signed)
Bacterial Vaginosis Bacterial vaginosis is an infection of the vagina. It happens when too many of certain germs (bacteria) grow in the vagina. HOME CARE  Take your medicine as told by your doctor.  Finish your medicine even if you start to feel better.  Do not have sex until you finish your medicine and are better.  Tell your sex partner that you have an infection. They should see their doctor for treatment.  Practice safe sex. Use condoms. Have only one sex partner. GET HELP IF:  You are not getting better after 3 days of treatment.  You have more grey fluid (discharge) coming from your vagina than before.  You have more pain than before.  You have a fever. MAKE SURE YOU:   Understand these instructions.  Will watch your condition.  Will get help right away if you are not doing well or get worse. Document Released: 10/03/2007 Document Revised: 10/14/2012 Document Reviewed: 08/05/2012 ExitCare Patient Information 2015 [employer]. This information is not intended to replace advice given to you by your health care provider. Make sure you discuss any questions you have with your health care provider.  

## 2014-06-15 NOTE — MAU Provider Note (Signed)
History     CSN: [id number]  Arrival date and time: 06/14/14 2223   First Provider Initiated Contact with Patient 06/15/14 0014      Chief Complaint  Patient presents with  . Possible Pregnancy  . Abdominal Pain   HPI  Ms. NASHAYLA TELLERIA is a 29 y.o. G4P0030 who presents to MAU today with complaint of a white vaginal discharge x 2 weeks. She states associated vaginal irritation. She denies abdominal pain, fever, UTI symptoms, N/V/D or constipation.   OB History    Gravida Para Term Preterm AB TAB SAB Ectopic Multiple Living   4 0  0 3 0 3         Past Medical History  Diagnosis Date  . Asthma   . Hypertension   . Pregnancy induced hypertension     No past surgical history on file.  Family History  Problem Relation Age of Onset  . Hypertension Mother   . Diabetes Father   . Hypertension Father     History  Substance Use Topics  . Smoking status: Current Some Day Smoker  . Smokeless tobacco: Not on file  . Alcohol Use: No    Allergies:  Allergies  Allergen Reactions  . Shellfish Allergy Anaphylaxis    No prescriptions prior to admission    Review of Systems  Constitutional: Negative for fever and malaise/fatigue.  Gastrointestinal: Negative for nausea, vomiting, abdominal pain, diarrhea and constipation.  Genitourinary: Negative for dysuria, urgency and frequency.       + vaginal discharge Neg - vaginal bleeding   Physical Exam   Blood pressure 127/82, pulse 83, temperature 98.7 F (37.1 C), temperature source Oral, resp. rate 18, height  (1.549 m), weight 193 lb (87.544 kg), last menstrual period 05/18/2014.  Physical Exam  Nursing note and vitals reviewed. Constitutional: She is oriented to person, place, and time. She appears well-developed and well-nourished. No distress.  HENT:  Head: Normocephalic and atraumatic.  Cardiovascular: Normal rate.   Respiratory: Effort normal.  GI: Soft. She exhibits no distension and no mass. There  is no tenderness. There is no rebound and no guarding.  Genitourinary: Uterus is not enlarged and not tender. Cervix exhibits no motion tenderness, no discharge and no friability. Right adnexum displays no mass and no tenderness. Left adnexum displays no mass and no tenderness. No bleeding in the vagina. Vaginal discharge (small amount of thin, white-grey discharge noted) found.  Neurological: She is alert and oriented to person, place, and time.  Skin: Skin is warm and dry. No erythema.  Psychiatric: She has a normal mood and affect.   Results for orders placed or performed during the hospital encounter of 06/14/14 (from the past 24 hour(s))  Urinalysis, Routine w reflex microscopic (not at [facility])     Status: Abnormal   Collection Time: 06/14/14 11:00 PM  Result Value Ref Range   Color, Urine YELLOW YELLOW   APPearance CLEAR CLEAR   Specific Gravity, Urine 1.015 1.005 - 1.030   pH 6.0 5.0 - 8.0   Glucose, UA NEGATIVE NEGATIVE mg/dL   Hgb urine dipstick TRACE (A) NEGATIVE   Bilirubin Urine NEGATIVE NEGATIVE   Ketones, ur NEGATIVE NEGATIVE mg/dL   Protein, ur NEGATIVE NEGATIVE mg/dL   Urobilinogen, UA 0.2 0.0 - 1.0 mg/dL   Nitrite NEGATIVE NEGATIVE   Leukocytes, UA LARGE (A) NEGATIVE  Urine microscopic-add on     Status: None   Collection Time: 06/14/14 11:00 PM  Result Value Ref Range  Squamous Epithelial / LPF RARE RARE   WBC, UA 3-6 <3 WBC/hpf   RBC / HPF 0-2 <3 RBC/hpf   Bacteria, UA RARE RARE   Urine-Other TRICHOMONAS PRESENT   Pregnancy, urine POC     Status: None   Collection Time: 06/15/14 12:06 AM  Result Value Ref Range   Preg Test, Ur NEGATIVE NEGATIVE  Wet prep, genital     Status: Abnormal   Collection Time: 06/15/14 12:20 AM  Result Value Ref Range   Yeast Wet Prep HPF POC NONE SEEN NONE SEEN   Trich, Wet Prep FEW (A) NONE SEEN   Clue Cells Wet Prep HPF POC FEW (A) NONE SEEN   WBC, Wet Prep HPF POC MANY (A) NONE SEEN    MAU Course  Procedures None  MDM UPT  - negative Wet prep, GC/Chlamydia, RPR and HIV  Assessment and Plan  A: Trichomonas  P: Discharge home Rx for Flagyl sent to patient's pharmacy Partner treatment and abstinence during treatment  discussed Patient advised to follow-up with PCP at [facility] as scheduled for routine health maintenance or sooner if symptoms were to change or worsen Patient may return to MAU as needed or if her condition were to change or worsen   Marny LowensteinJulie N Mansel Strother, PA-C  06/15/2014, 2:11 AM

## 2014-06-16 ENCOUNTER — Telehealth: Payer: Self-pay | Admitting: *Deleted

## 2014-06-16 DIAGNOSIS — Z202 Contact with and (suspected) exposure to infections with a predominantly sexual mode of transmission: Secondary | ICD-10-CM

## 2014-06-16 MED ORDER — AZITHROMYCIN 500 MG PO TABS
ORAL_TABLET | ORAL | Status: DC
Start: 2014-06-16 — End: 2015-02-03

## 2014-06-16 NOTE — Telephone Encounter (Signed)
Telephone call to patient regarding positive chlamydia culture, patient notified. Patient has not been treated and Rx called in per protocol to pharmacy.  Instructed patient to notify her partner for treatment and to abstain from sex for seven days post treatment.

## 2015-01-31 ENCOUNTER — Encounter: Payer: No Typology Code available for payment source | Admitting: Family Medicine

## 2015-02-03 ENCOUNTER — Other Ambulatory Visit: Payer: Self-pay | Admitting: Family Medicine

## 2015-02-03 ENCOUNTER — Encounter: Payer: No Typology Code available for payment source | Admitting: Family Medicine

## 2015-03-10 ENCOUNTER — Other Ambulatory Visit: Payer: Self-pay | Admitting: Family Medicine

## 2015-03-10 MED ORDER — ALBUTEROL SULFATE HFA 108 (90 BASE) MCG/ACT IN AERS: 2.0000 | INHALATION_SPRAY | RESPIRATORY_TRACT | Status: DC | PRN

## 2015-03-10 NOTE — Telephone Encounter (Signed)
Refill request for Albuterol Inhaler

## 2015-04-06 ENCOUNTER — Ambulatory Visit (INDEPENDENT_AMBULATORY_CARE_PROVIDER_SITE_OTHER): Payer: Medicaid Other | Admitting: Family Medicine

## 2015-04-06 ENCOUNTER — Encounter: Payer: Self-pay | Admitting: Family Medicine

## 2015-04-06 VITALS — BP 134/88 | HR 99 | Temp 98.9°F | Wt 193.9 lb

## 2015-04-06 DIAGNOSIS — I10 Essential (primary) hypertension: Secondary | ICD-10-CM

## 2015-04-06 MED ORDER — AMLODIPINE BESYLATE 5 MG PO TABS: 5.0000 mg | ORAL_TABLET | Freq: Every day | ORAL | Status: DC

## 2015-04-06 NOTE — Progress Notes (Signed)
   Subjective:    Patient ID: Debra Strong, female    DOB: November 08, 1985, 30 y.o.   MRN: [id number]  HPI  Patient presents for Same Day Appointment  CC: high blood pressure  # High blood pressure:  Had been on amlodipine in the past, last took the medicine last year  Feels more stressed lately with anxiety about starting a new job, hasn't been working in over 1 year  Feels like she is retaining a lot of fluid in her hands and feet. Voiding normally. No changes in diet (maybe eating a little more salty foods)  Went to [employer] and BP was "high" but can't remember the number, this was about 1 month ago ROS: +headaches for past week (not typical for her, ibuprofen helping), no chest pains, no shortness of breath, +heartburn.   Social Hx: smokes 2-3 cigarettes a day, identifies not workign as keeping her busy  Review of Systems   See HPI for ROS.   Past medical history, surgical, family, and social history reviewed and updated in the EMR as appropriate.  Objective:  BP 134/88 mmHg  Pulse 99  Temp(Src) 98.9 F (37.2 C) (Oral)  Wt 193 lb 14.4 oz (87.952 kg)  LMP 03/18/2015 (Exact Date) Vitals and nursing note reviewed  General: no apparent distress  CV: normal rate, regular rhythm, no murmurs, rubs or gallop.  Resp: clear to auscultation bilaterally, normal effort  Assessment & Plan:  Essential hypertension, benign BP actually in prehypertensive range today/at goal, however she reports some higher BP at home. Will restart amlodipine as previously on, recommended follow up in about 1 month to recheck BP and possibly do blood work.

## 2015-04-06 NOTE — Patient Instructions (Addendum)
Restart the amlodipine 5mg , take that once daily.  Come back in about 1 month for blood pressure, possible lab work.  Flonase, Nasacort, Nasonex -- they are all over the counter steroid nasal sprays, follows the directions on the box, can take more than 2 weeks (can take long term)

## 2015-04-07 NOTE — Assessment & Plan Note (Signed)
BP actually in prehypertensive range today/at goal, however she reports some higher BP at home. Will restart amlodipine as previously on, recommended follow up in about 1 month to recheck BP and possibly do blood work.

## 2015-05-30 ENCOUNTER — Encounter: Payer: Self-pay | Admitting: Certified Nurse Midwife

## 2015-05-30 ENCOUNTER — Ambulatory Visit (INDEPENDENT_AMBULATORY_CARE_PROVIDER_SITE_OTHER): Payer: Medicaid Other | Admitting: Certified Nurse Midwife

## 2015-05-30 VITALS — BP 130/84 | HR 95 | Wt 200.0 lb

## 2015-05-30 DIAGNOSIS — Z0000 Encounter for general adult medical examination without abnormal findings: Secondary | ICD-10-CM

## 2015-05-30 DIAGNOSIS — Z01419 Encounter for gynecological examination (general) (routine) without abnormal findings: Secondary | ICD-10-CM | POA: Diagnosis not present

## 2015-05-30 DIAGNOSIS — Z716 Tobacco abuse counseling: Secondary | ICD-10-CM | POA: Insufficient documentation

## 2015-05-30 DIAGNOSIS — Z113 Encounter for screening for infections with a predominantly sexual mode of transmission: Secondary | ICD-10-CM

## 2015-05-30 MED ORDER — VARENICLINE TARTRATE 0.5 MG X 11 & 1 MG X 42 PO MISC: ORAL | Status: DC

## 2015-05-30 MED ORDER — VARENICLINE TARTRATE 1 MG PO TABS: 1.0000 mg | ORAL_TABLET | Freq: Two times a day (BID) | ORAL | Status: DC

## 2015-05-30 NOTE — Progress Notes (Signed)
Patient ID: Debra AlaMarqueta R Mccombs, female   DOB: [birth year], 30 y.o.   MRN: [id number]  Subjective:      Debra Strong is a 30 y.o. female here for a routine exam.  Current complaints: none.  Has changed her diet.  Has 31 year old son.  Current smoker: down to 2 cigs/day. Has been trying to quit smoking.  Desires to try Chantix.  Has tried birth control pills in the past.  Has issues remembering to take the pill.  Using condoms currently.  Having monthly periods, lasting about 5-7 days, denies any heavy bleeding or clots.  Has dysmenorreha before and after periods.  Not currently sexually active.     Personal health questionnaire:  Is patient Ashkenazi Jewish, have a family history of breast and/or ovarian cancer: no Is there a family history of uterine cancer diagnosed at age < 5350, gastrointestinal cancer, urinary tract cancer, family member who is a Personnel officerLynch syndrome-associated carrier: no Is the patient overweight and hypertensive, family history of diabetes, personal history of gestational diabetes, preeclampsia or PCOS: yes Is patient over 5155, have PCOS,  family history of premature CHD under age 31, diabetes, smoke, have hypertension or peripheral artery disease:  yes At any time, has a partner hit, kicked or otherwise hurt or frightened you?: no Over the past 2 weeks, have you felt down, depressed or hopeless?: no Over the past 2 weeks, have you felt little interest or pleasure in doing things?:no   Gynecologic History Patient's last menstrual period was 05/18/2015. Contraception: abstinence and condoms Last Pap: unknown. Results were: normal according to the patient Last mammogram: N/A.   Obstetric History OB History  Gravida Para Term Preterm AB SAB TAB Ectopic Multiple Living  4 0  0 3 3 0       # Outcome Date GA Lbr Len/2nd Weight Sex Delivery Anes PTL Lv  4 SAB 08/17/09          3 Gravida      Vag-Spont     2 SAB           1 SAB              Comments: System Generated.  Please review and update pregnancy details.      Past Medical History  Diagnosis Date  . Asthma   . Hypertension   . Pregnancy induced hypertension     No past surgical history on file.   Current outpatient prescriptions:  .  albuterol (PROVENTIL HFA;VENTOLIN HFA) 108 (90 Base) MCG/ACT inhaler, Inhale 2 puffs into the lungs every 4 (four) hours as needed for wheezing or shortness of breath (cough)., Disp: 18 g, Rfl: 3 .  amLODipine (NORVASC) 5 MG tablet, Take 1 tablet (5 mg total) by mouth daily., Disp: 90 tablet, Rfl: 0 .  Multiple Vitamin (MULTIVITAMIN WITH MINERALS) TABS tablet, Take 1 tablet by mouth daily., Disp: , Rfl:  .  varenicline (CHANTIX CONTINUING MONTH PAK) 1 MG tablet, Take 1 tablet (1 mg total) by mouth 2 (two) times daily., Disp: 60 tablet, Rfl: 5 .  varenicline (CHANTIX STARTING MONTH PAK) 0.5 MG X 11 & 1 MG X 42 tablet, Take a 0.5mg  tablet 1X/day for 3 days, increase to twice a day for 4 days, then take 1 mg tablet twice a day., Disp: 53 tablet, Rfl: 0 Allergies  Allergen Reactions  . Amlodipine Palpitations    Pt states she was taking anxiety medication as well and does not know which was causing the  heart palpations.  . Lisinopril Nausea Only and Other (See Comments)    Dizziness  . Shellfish Allergy Anaphylaxis    Social History  Substance Use Topics  . Smoking status: Current Some Day Smoker  . Smokeless tobacco: Not on file  . Alcohol Use: No    Family History  Problem Relation Age of Onset  . Hypertension Mother   . Diabetes Father   . Hypertension Father       Review of Systems  Constitutional: negative for fatigue and weight loss Respiratory: negative for cough and wheezing Cardiovascular: negative for chest pain, fatigue and palpitations Gastrointestinal: negative for abdominal pain and change in bowel habits Musculoskeletal:negative for myalgias Neurological: negative for gait problems and tremors Behavioral/Psych: negative for abusive  relationship, depression Endocrine: negative for temperature intolerance   Genitourinary:negative for abnormal menstrual periods, genital lesions, hot flashes, sexual problems and vaginal discharge Integument/breast: negative for breast lump, breast tenderness, nipple discharge and skin lesion(s)    Objective:       BP 130/84 mmHg  Pulse 95  Wt 200 lb (90.719 kg)  LMP 05/18/2015 General:   alert  Skin:   no rash or abnormalities  Lungs:   clear to auscultation bilaterally  Heart:   regular rate and rhythm, S1, S2 normal, no murmur, click, rub or gallop  Breasts:   normal without suspicious masses, skin or nipple changes or axillary nodes  Abdomen:  normal findings: no organomegaly, soft, non-tender and no hernia  Pelvis:  External genitalia: normal general appearance Urinary system: urethral meatus normal and bladder without fullness, nontender Vaginal: normal without tenderness, induration or masses Cervix: normal appearance Adnexa: normal bimanual exam Uterus: anteverted and non-tender, normal size   Lab Review Urine pregnancy test Labs reviewed yes Radiologic studies reviewed no  50% of 30 min visit spent on counseling and coordination of care.   Assessment:    Healthy female exam.   Contraceptive counseling  STD screening exam  Tobacco abuse/cessation counseling  Plan:    Education reviewed: calcium supplements, depression evaluation, low fat, low cholesterol diet, safe sex/STD prevention, self breast exams, skin cancer screening and weight bearing exercise. Contraception: planning IUD. Follow up in: 1 month.   Meds ordered this encounter  Medications  . varenicline (CHANTIX CONTINUING MONTH PAK) 1 MG tablet    Sig: Take 1 tablet (1 mg total) by mouth 2 (two) times daily.    Dispense:  60 tablet    Refill:  5  . varenicline (CHANTIX STARTING MONTH PAK) 0.5 MG X 11 & 1 MG X 42 tablet    Sig: Take a 0.5mg  tablet 1X/day for 3 days, increase to twice a day for 4  days, then take 1 mg tablet twice a day.    Dispense:  53 tablet    Refill:  0   Orders Placed This Encounter  Procedures  . Hepatitis B surface antigen  . RPR  . Hepatitis C antibody  . HIV antibody    Possible management options include: Nexplanon, Mirena IUD

## 2015-05-31 LAB — HEPATITIS C ANTIBODY: Hep C Virus Ab: <0.1 s/co ratio (ref 0.0–0.9)

## 2015-05-31 LAB — HIV ANTIBODY (ROUTINE TESTING W REFLEX): HIV SCREEN 4TH GENERATION: NONREACTIVE

## 2015-05-31 LAB — RPR: RPR Ser Ql: NONREACTIVE

## 2015-05-31 LAB — HEPATITIS B SURFACE ANTIGEN: Hepatitis B Surface Ag: NEGATIVE

## 2015-06-02 LAB — PAP IG AND HPV HIGH-RISK
HPV, HIGH-RISK: NEGATIVE
PAP SMEAR COMMENT: 0

## 2015-06-02 LAB — NUSWAB VG+, CANDIDA 6SP
CANDIDA ALBICANS, NAA: NEGATIVE
CANDIDA GLABRATA, NAA: NEGATIVE
CANDIDA TROPICALIS, NAA: NEGATIVE
Candida krusei, NAA: NEGATIVE
Candida lusitaniae, NAA: NEGATIVE
Candida parapsilosis, NAA: NEGATIVE
Chlamydia trachomatis, NAA: NEGATIVE
MEGASPHAERA 1: HIGH {score} — AB
NEISSERIA GONORRHOEAE, NAA: NEGATIVE
Trich vag by NAA: NEGATIVE

## 2015-06-06 ENCOUNTER — Other Ambulatory Visit: Payer: Self-pay | Admitting: Certified Nurse Midwife

## 2015-06-06 DIAGNOSIS — B9689 Other specified bacterial agents as the cause of diseases classified elsewhere: Secondary | ICD-10-CM

## 2015-06-06 DIAGNOSIS — N760 Acute vaginitis: Principal | ICD-10-CM

## 2015-06-06 MED ORDER — METRONIDAZOLE 500 MG PO TABS: 500.0000 mg | ORAL_TABLET | Freq: Two times a day (BID) | ORAL | Status: DC

## 2015-06-08 ENCOUNTER — Encounter: Payer: Self-pay | Admitting: *Deleted

## 2015-06-20 ENCOUNTER — Ambulatory Visit: Payer: Medicaid Other | Admitting: Certified Nurse Midwife

## 2015-06-22 ENCOUNTER — Encounter: Payer: Self-pay | Admitting: *Deleted

## 2015-07-19 ENCOUNTER — Other Ambulatory Visit: Payer: Self-pay | Admitting: Family Medicine

## 2015-07-29 ENCOUNTER — Encounter (HOSPITAL_COMMUNITY): Payer: Self-pay | Admitting: *Deleted

## 2015-07-29 ENCOUNTER — Inpatient Hospital Stay (HOSPITAL_COMMUNITY)
Admission: AD | Admit: 2015-07-29 | Discharge: 2015-07-29 | Disposition: A | Discharge status: Home / Self Care | Payer: Medicaid Other | Source: Ambulatory Visit | Attending: Family Medicine | Admitting: Family Medicine

## 2015-07-29 DIAGNOSIS — N938 Other specified abnormal uterine and vaginal bleeding: Secondary | ICD-10-CM | POA: Insufficient documentation

## 2015-07-29 DIAGNOSIS — I10 Essential (primary) hypertension: Secondary | ICD-10-CM | POA: Diagnosis not present

## 2015-07-29 DIAGNOSIS — J45909 Unspecified asthma, uncomplicated: Secondary | ICD-10-CM | POA: Diagnosis not present

## 2015-07-29 LAB — URINALYSIS, ROUTINE W REFLEX MICROSCOPIC
BILIRUBIN URINE: NEGATIVE
Glucose, UA: NEGATIVE mg/dL
KETONES UR: NEGATIVE mg/dL
Nitrite: NEGATIVE
Protein, ur: NEGATIVE mg/dL
Specific Gravity, Urine: 1.02 (ref 1.005–1.030)
pH: 5.5 (ref 5.0–8.0)

## 2015-07-29 LAB — WET PREP, GENITAL
Clue Cells Wet Prep HPF POC: NONE SEEN
Sperm: NONE SEEN
TRICH WET PREP: NONE SEEN
YEAST WET PREP: NONE SEEN

## 2015-07-29 LAB — URINE MICROSCOPIC-ADD ON

## 2015-07-29 LAB — POCT PREGNANCY, URINE: Preg Test, Ur: NEGATIVE

## 2015-07-29 NOTE — MAU Note (Signed)
Pt states she woke up this morning to menstrual pain.  Pt states she is having bloody brown discharge that "she has never seen in her life."  Pt states she went on her cycle on 07/15/15 and came off 6 days later.

## 2015-07-29 NOTE — MAU Provider Note (Signed)
History    Pt states she woke up this morning to menstrual pain. Pt states she is having bloody brown discharge that "she has never seen in her life." Pt states she went on her cycle on 07/15/15 and came off 6 days later. This is a unusual occurrence pt states her periods are reg and this is the first time this has happened to her.  CSN: [id number]  Arrival date & time 07/29/15  1605   First Provider Initiated Contact with Patient 07/29/15 1651      Chief Complaint  Patient presents with  . Vaginal Bleeding    HPI  Past Medical History  Diagnosis Date  . Asthma   . Hypertension   . Pregnancy induced hypertension     Past Surgical History  Procedure Laterality Date  . No past surgeries      Family History  Problem Relation Age of Onset  . Hypertension Mother   . Diabetes Father   . Hypertension Father     Social History  Substance Use Topics  . Smoking status: Current Some Day Smoker  . Smokeless tobacco: None  . Alcohol Use: No    OB History    Gravida Para Term Preterm AB TAB SAB Ectopic Multiple Living   4 0  0 3 0 3   1      Review of Systems  Constitutional: Negative.   HENT: Negative.   Eyes: Negative.   Respiratory: Negative.   Cardiovascular: Negative.   Gastrointestinal: Negative.   Endocrine: Negative.   Genitourinary: Positive for vaginal bleeding.  Musculoskeletal: Negative.   Skin: Negative.   Allergic/Immunologic: Negative.   Neurological: Negative.   Hematological: Negative.   Psychiatric/Behavioral: Negative.     Allergies  Amlodipine; Lisinopril; and Shellfish allergy  Home Medications  No current outpatient prescriptions on file.  BP 119/77 mmHg  Pulse 101  Temp(Src) 98.4 F (36.9 C) (Oral)  Resp 16  LMP 07/15/2015  Physical Exam  Constitutional: She is oriented to person, place, and time. She appears well-developed and well-nourished.  HENT:  Head: Normocephalic.  Eyes: Pupils are equal, round, and reactive to light.   Neck: Normal range of motion.  Cardiovascular: Normal rate, regular rhythm, normal heart sounds and intact distal pulses.   Pulmonary/Chest: Effort normal and breath sounds normal.  Abdominal: Soft. Bowel sounds are normal.  Genitourinary: Vagina normal and uterus normal.  Musculoskeletal: Normal range of motion.  Neurological: She is alert and oriented to person, place, and time. She has normal reflexes.  Skin: Skin is warm and dry.  Psychiatric: She has a normal mood and affect. Her behavior is normal. Judgment and thought content normal.    MAU Course  Procedures (including critical care time)  Labs Reviewed  WET PREP, GENITAL - Abnormal; Notable for the following:    WBC, Wet Prep HPF POC FEW (*)    All other components within normal limits  URINALYSIS, ROUTINE W REFLEX MICROSCOPIC (NOT AT [facility]) - Abnormal; Notable for the following:    Hgb urine dipstick LARGE (*)    Leukocytes, UA SMALL (*)    All other components within normal limits  URINE MICROSCOPIC-ADD ON - Abnormal; Notable for the following:    Squamous Epithelial / LPF 6-30 (*)    Bacteria, UA MANY (*)    All other components within normal limits  HIV ANTIBODY (ROUTINE TESTING)  POCT PREGNANCY, URINE  GC/CHLAMYDIA PROBE AMP ([place]) NOT AT [facility]   No results found.   1.  DUB (dysfunctional uterine bleeding)       MDM  Sterile spec exam done cervix smooth, pink, bulbous, no lesions. Scant amt brownish bleeding noted from os. Wet prep neg, GC and Chla obtained and pending. Will d/c pt home to follow up with Dr. Clearance Coots if it reocures.

## 2015-07-30 LAB — HIV ANTIBODY (ROUTINE TESTING W REFLEX): HIV Screen 4th Generation wRfx: NONREACTIVE

## 2015-07-31 LAB — GC/CHLAMYDIA PROBE AMP (~~LOC~~) NOT AT ARMC
CHLAMYDIA, DNA PROBE: NEGATIVE
Neisseria Gonorrhea: NEGATIVE

## 2015-08-19 ENCOUNTER — Other Ambulatory Visit: Payer: Self-pay | Admitting: Family Medicine

## 2015-08-19 DIAGNOSIS — I10 Essential (primary) hypertension: Secondary | ICD-10-CM

## 2015-08-21 ENCOUNTER — Other Ambulatory Visit: Payer: Self-pay | Admitting: Family Medicine

## 2015-08-21 DIAGNOSIS — I10 Essential (primary) hypertension: Secondary | ICD-10-CM

## 2015-08-22 ENCOUNTER — Other Ambulatory Visit: Payer: Self-pay | Admitting: Family Medicine

## 2015-08-22 DIAGNOSIS — I10 Essential (primary) hypertension: Secondary | ICD-10-CM

## 2015-08-23 ENCOUNTER — Telehealth: Payer: Self-pay | Admitting: Family Medicine

## 2015-08-23 NOTE — Telephone Encounter (Signed)
Pt stated her BP medication was denied. Pt would like it called into [facility] [address] [place]. Please advise. Thanks! ep

## 2015-08-23 NOTE — Telephone Encounter (Signed)
Medication was denied by PCP.  Medication is listed as an allergy.  Patient have patient schedule an appointment per PCP.  Clovis PuMartin, Amiee Wiley L, RN

## 2015-08-25 ENCOUNTER — Encounter: Payer: Self-pay | Admitting: Family Medicine

## 2015-08-25 ENCOUNTER — Ambulatory Visit (INDEPENDENT_AMBULATORY_CARE_PROVIDER_SITE_OTHER): Payer: Medicaid Other | Admitting: Family Medicine

## 2015-08-25 VITALS — BP 139/80 | HR 104 | Temp 98.3°F | Ht 61.0 in | Wt 201.0 lb

## 2015-08-25 DIAGNOSIS — R609 Edema, unspecified: Secondary | ICD-10-CM | POA: Insufficient documentation

## 2015-08-25 DIAGNOSIS — I10 Essential (primary) hypertension: Secondary | ICD-10-CM

## 2015-08-25 DIAGNOSIS — Z8742 Personal history of other diseases of the female genital tract: Secondary | ICD-10-CM | POA: Diagnosis not present

## 2015-08-25 LAB — CBC
HCT: 39 % (ref 35.0–45.0)
Hemoglobin: 13.4 g/dL (ref 11.7–15.5)
MCH: 30.1 pg (ref 27.0–33.0)
MCHC: 34.4 g/dL (ref 32.0–36.0)
MCV: 87.6 fL (ref 80.0–100.0)
MPV: 10.8 fL (ref 7.5–12.5)
Platelets: 297 10*3/uL (ref 140–400)
RBC: 4.45 MIL/uL (ref 3.80–5.10)
RDW: 14.2 % (ref 11.0–15.0)
WBC: 6.5 10*3/uL (ref 3.8–10.8)

## 2015-08-25 LAB — POCT URINE PREGNANCY: Preg Test, Ur: NEGATIVE

## 2015-08-25 MED ORDER — FUROSEMIDE 20 MG PO TABS: 20.0000 mg | ORAL_TABLET | Freq: Every day | ORAL | 3 refills | Status: DC

## 2015-08-25 NOTE — Progress Notes (Signed)
    Subjective:  Debra Strong is a 30 y.o. female who [facility] today with a chief complaint of swelling in feet and hands.   HPI: Peripheral edema and BP concerns States feels like BP has been issue because ever since been off BP meds has had swelling in feet and hands over the last month. Makes her feel anxious and is asking for BP medications. States has been on lisinopril in the past which made her lightheaded and dizzy. Was also recently tried on amlodipine but was on paxil at the same time which gave her palpitations. Denies CP, SOB. Endorses some mild HA and occasional blurry vision. Has tried to eat a low salt diet   H/o dysfunctional uterine bleeding States last period was unusually long and heavy, went to women[facility] and was told that all results were normal at that time. States before this occurred at the end of July (MAU note on 07/29/15), had always had regular periods. Has not had a period since.  ROS: Per HPI  Objective:  Physical Exam: BP 139/80   Pulse (!) 104   Temp 98.3 F (36.8 C) (Oral)   Ht 5\' 1"  (1.549 m)   Wt 201 lb (91.2 kg)   LMP 07/15/2015   BMI 37.98 kg/m   Gen: NAD, resting comfortably HEENT: EOMI, PERRL CV: RRR with no murmurs appreciated Pulm: NWOB, CTAB with no crackles, wheezes, or rhonchi MSK: questionable nonpitting edema of feet, if present is trace. No cyanosis or clubbing Skin: warm, dry Neuro: grossly normal, moves all extremities. Strength 5/5 Psych: Normal affect and thought content  Results for orders placed or performed in visit on 08/25/15 (from the past 72 hour(s))  POCT urine pregnancy     Status: None   Collection Time: 08/25/15  3:30 PM  Result Value Ref Range   Preg Test, Ur Negative Negative     Assessment/Plan:  Essential hypertension, benign Since BP not in concerning range, will continue to monitor. Encouraged lifestyle modifications  Peripheral edema Check CBC for possible anemia, BMP, TSH Start lasix  20mg  qd and see back in 1 month for improvement.  Will discuss dysfunctional uterine bleeding if continues to be an issue on next appt. Urine pregnancy test neg today.   Leland HerElsia J Mandy Fitzwater, DO [facility] Resident PGY-1 08/25/2015 6:02 PM

## 2015-08-25 NOTE — Assessment & Plan Note (Signed)
Since BP not in concerning range, will continue to monitor. Encouraged lifestyle modifications

## 2015-08-25 NOTE — Assessment & Plan Note (Signed)
Check CBC for possible anemia, BMP, TSH Start lasix 20mg  qd and see back in 1 month for improvement.

## 2015-08-25 NOTE — Patient Instructions (Signed)
Thank you for coming in today. You were seen to discuss your blood pressure and your concern of swelling in your hands/feet. Please start taking lasix and we will check some labs today.  Edema Edema is an abnormal buildup of fluids in your bodytissues. Edema is somewhatdependent on gravity to pull the fluid to the lowest place in your body. That makes the condition more common in the legs and thighs (lower extremities). Painless swelling of the feet and ankles is common and becomes more likely as you get older. It is also common in looser tissues, like around your eyes.  When the affected area is squeezed, the fluid may move out of that spot and leave a dent for a few moments. This dent is called pitting.  CAUSES  There are many possible causes of edema. Eating too much salt and being on your feet or sitting for a long time can cause edema in your legs and ankles. Hot weather may make edema worse. Common medical causes of edema include:  Heart failure.  Liver disease.  Kidney disease.  Weak blood vessels in your legs.  Cancer.  An injury.  Pregnancy.  Some medications.  Obesity. SYMPTOMS  Edema is usually painless.Your skin may look swollen or shiny.  DIAGNOSIS  Your health care provider may be able to diagnose edema by asking about your medical history and doing a physical exam. You may need to have tests such as X-rays, an electrocardiogram, or blood tests to check for medical conditions that may cause edema.  TREATMENT  Edema treatment depends on the cause. If you have heart, liver, or kidney disease, you need the treatment appropriate for these conditions. General treatment may include:  Elevation of the affected body part above the level of your heart.  Compression of the affected body part. Pressure from elastic bandages or support stockings squeezes the tissues and forces fluid back into the blood vessels. This keeps fluid from entering the tissues.  Restriction of  fluid and salt intake.  Use of a water pill (diuretic). These medications are appropriate only for some types of edema. They pull fluid out of your body and make you urinate more often. This gets rid of fluid and reduces swelling, but diuretics can have side effects. Only use diuretics as directed by your health care provider. HOME CARE INSTRUCTIONS   Keep the affected body part above the level of your heart when you are lying down.   Do not sit still or stand for prolonged periods.   Do not put anything directly under your knees when lying down.  Do not wear constricting clothing or garters on your upper legs.   Exercise your legs to work the fluid back into your blood vessels. This may help the swelling go down.   Wear elastic bandages or support stockings to reduce ankle swelling as directed by your health care provider.   Eat a low-salt diet to reduce fluid if your health care provider recommends it.   Only take medicines as directed by your health care provider. SEEK MEDICAL CARE IF:   Your edema is not responding to treatment.  You have heart, liver, or kidney disease and notice symptoms of edema.  You have edema in your legs that does not improve after elevating them.   You have sudden and unexplained weight gain. SEEK IMMEDIATE MEDICAL CARE IF:   You develop shortness of breath or chest pain.   You cannot breathe when you lie down.  You develop pain,  redness, or warmth in the swollen areas.   You have heart, liver, or kidney disease and suddenly get edema.  You have a fever and your symptoms suddenly get worse. MAKE SURE YOU:   Understand these instructions.  Will watch your condition.  Will get help right away if you are not doing well or get worse.   This information is not intended to replace advice given to you by your health care provider. Make sure you discuss any questions you have with your health care provider.   Document Released:  12/24/2004 Document Revised: 01/14/2014 Document Reviewed: 10/16/2012 Elsevier Interactive Patient Education [employer]2016 Elsevier Inc.

## 2015-08-26 LAB — BASIC METABOLIC PANEL WITH GFR
BUN: 10 mg/dL (ref 7–25)
CO2: 28 mmol/L (ref 20–31)
Calcium: 9.1 mg/dL (ref 8.6–10.2)
Chloride: 107 mmol/L (ref 98–110)
Creat: 0.89 mg/dL (ref 0.50–1.10)
GFR, EST NON AFRICAN AMERICAN: 87 mL/min (ref >=60)
GFR, Est African American: >89 mL/min (ref >=60)
GLUCOSE: 81 mg/dL (ref 65–99)
POTASSIUM: 3.8 mmol/L (ref 3.5–5.3)
Sodium: 141 mmol/L (ref 135–146)

## 2015-08-26 LAB — TSH: TSH: 0.54 mIU/L

## 2015-09-15 ENCOUNTER — Ambulatory Visit: Payer: Medicaid Other | Admitting: Family Medicine

## 2016-02-26 ENCOUNTER — Other Ambulatory Visit: Payer: Self-pay | Admitting: Family Medicine

## 2016-03-08 ENCOUNTER — Ambulatory Visit: Payer: Medicaid Other

## 2016-03-15 ENCOUNTER — Other Ambulatory Visit: Payer: Self-pay | Admitting: *Deleted

## 2016-03-15 DIAGNOSIS — I10 Essential (primary) hypertension: Secondary | ICD-10-CM

## 2016-03-15 DIAGNOSIS — R609 Edema, unspecified: Secondary | ICD-10-CM

## 2016-03-15 MED ORDER — FUROSEMIDE 20 MG PO TABS: 20.0000 mg | ORAL_TABLET | Freq: Every day | ORAL | 0 refills | Status: DC

## 2016-03-15 NOTE — Telephone Encounter (Signed)
Please contact patient. I will like to reassess her HTN and leg edema before I continue to refill her Lasix. Please advise her that I refilled her Lasix for 4 weeks supply only. Please scheduled follow up appointment with me on her behalf. Thanks.

## 2016-03-15 NOTE — Telephone Encounter (Signed)
Apt scheduled for 4/3, pt notified.

## 2016-03-28 ENCOUNTER — Emergency Department (HOSPITAL_COMMUNITY)
Admission: EM | Admit: 2016-03-28 | Discharge: 2016-03-29 | Disposition: A | Discharge status: Home / Self Care | Payer: Medicaid Other | Attending: Emergency Medicine | Admitting: Emergency Medicine

## 2016-03-28 ENCOUNTER — Encounter (HOSPITAL_COMMUNITY): Payer: Self-pay

## 2016-03-28 DIAGNOSIS — Z79899 Other long term (current) drug therapy: Secondary | ICD-10-CM | POA: Diagnosis not present

## 2016-03-28 DIAGNOSIS — J45909 Unspecified asthma, uncomplicated: Secondary | ICD-10-CM | POA: Diagnosis not present

## 2016-03-28 DIAGNOSIS — R1013 Epigastric pain: Secondary | ICD-10-CM | POA: Insufficient documentation

## 2016-03-28 DIAGNOSIS — I10 Essential (primary) hypertension: Secondary | ICD-10-CM | POA: Insufficient documentation

## 2016-03-28 DIAGNOSIS — F17200 Nicotine dependence, unspecified, uncomplicated: Secondary | ICD-10-CM | POA: Diagnosis not present

## 2016-03-28 LAB — URINALYSIS, ROUTINE W REFLEX MICROSCOPIC
Bilirubin Urine: NEGATIVE
GLUCOSE, UA: NEGATIVE mg/dL
HGB URINE DIPSTICK: NEGATIVE
KETONES UR: NEGATIVE mg/dL
Leukocytes, UA: NEGATIVE
Nitrite: NEGATIVE
PROTEIN: NEGATIVE mg/dL
Specific Gravity, Urine: 1.012 (ref 1.005–1.030)
pH: 6 (ref 5.0–8.0)

## 2016-03-28 LAB — LIPASE, BLOOD: LIPASE: 19 U/L (ref 11–51)

## 2016-03-28 LAB — CBC
HCT: 40.1 % (ref 36.0–46.0)
Hemoglobin: 13.8 g/dL (ref 12.0–15.0)
MCH: 30.2 pg (ref 26.0–34.0)
MCHC: 34.4 g/dL (ref 30.0–36.0)
MCV: 87.7 fL (ref 78.0–100.0)
PLATELETS: 324 10*3/uL (ref 150–400)
RBC: 4.57 MIL/uL (ref 3.87–5.11)
RDW: 13.2 % (ref 11.5–15.5)
WBC: 7.9 10*3/uL (ref 4.0–10.5)

## 2016-03-28 LAB — COMPREHENSIVE METABOLIC PANEL
ALK PHOS: 78 U/L (ref 38–126)
ALT: 38 U/L (ref 14–54)
ANION GAP: 10 (ref 5–15)
AST: 27 U/L (ref 15–41)
Albumin: 4 g/dL (ref 3.5–5.0)
BUN: 10 mg/dL (ref 6–20)
CHLORIDE: 102 mmol/L (ref 101–111)
CO2: 28 mmol/L (ref 22–32)
CREATININE: 0.91 mg/dL (ref 0.44–1.00)
Calcium: 9.4 mg/dL (ref 8.9–10.3)
GFR calc Af Amer: >60 mL/min (ref >60)
Glucose, Bld: 103 mg/dL — ABNORMAL HIGH (ref 65–99)
Potassium: 3.8 mmol/L (ref 3.5–5.1)
SODIUM: 140 mmol/L (ref 135–145)
Total Bilirubin: 0.5 mg/dL (ref 0.3–1.2)
Total Protein: 7.5 g/dL (ref 6.5–8.1)

## 2016-03-28 LAB — I-STAT BETA HCG BLOOD, ED (MC, WL, AP ONLY): I-stat hCG, quantitative: <5 m[IU]/mL (ref <5)

## 2016-03-28 MED ORDER — GI COCKTAIL ~~LOC~~
30.0000 mL | Freq: Once | ORAL | Status: AC
  Administered 2016-03-28: 30 mL via ORAL
  Filled 2016-03-28: qty 30

## 2016-03-28 MED ORDER — RANITIDINE HCL 150 MG PO CAPS: 150.0000 mg | ORAL_CAPSULE | Freq: Every day | ORAL | 0 refills | Status: DC

## 2016-03-28 MED ORDER — OMEPRAZOLE 20 MG PO CPDR: 20.0000 mg | DELAYED_RELEASE_CAPSULE | Freq: Every day | ORAL | 0 refills | Status: DC

## 2016-03-28 NOTE — Discharge Instructions (Signed)
Please take prilosec and zantac 30 minutes before major meal as it may help with your abdominal pain.  Follow up with your doctor or with GI specialist for further evaluation of your symptoms.

## 2016-03-28 NOTE — ED Provider Notes (Signed)
[facility]-EM[facility]ENCY DEPT Provider Note   CSN: [id number] Arrival date & time: 03/28/16  2056     History   Chief Complaint Chief Complaint  Patient presents with  . Abdominal Pain    HPI Debra Strong is a 31 y.o. female.  HPI   31 year old female with history of anxiety, depression, hypertension, asthma presenting complaining of abdominal pain. Patient reports intermittent epigastric/left upper quadrant abdominal pain ongoing for the past year. Pain sometimes brought on by certain types of food. She describes pain as a sharp sensation, may last for minutes to hours, that has been recurrent. She endorses occasional loose stools. She is actively having mild-to-moderate pain at this time. She tries to change her diet without adequate improvement. She was taken Tylenol and ibuprofen but states it did not help. She denies any social fever, chills, chest pain, shortness of breath, cough, hemoptysis, back pain, dysuria, hematuria, or rash. Last measured. Was February 25. She has a PCP appointment on April 2. No prior abdominal surgery.  Past Medical History:  Diagnosis Date  . Asthma   . Hypertension   . Pregnancy induced hypertension     Patient Active Problem List   Diagnosis Date Noted  . Peripheral edema 08/25/2015  . Tobacco abuse counseling 05/30/2015  . Adjustment disorder with mixed anxiety and depressed mood 03/04/2014  . Essential hypertension, benign 09/10/2012  . Asthma 08/29/2006    Past Surgical History:  Procedure Laterality Date  . MOUTH SURGERY    . NO PAST SURGERIES      OB History    Gravida Para Term Preterm AB Living   4 0   0 3 1   SAB TAB Ectopic Multiple Live Births   3 0             Home Medications    Prior to Admission medications   Medication Sig Start Date End Date Taking? Authorizing Provider  cetirizine (ZYRTEC) 10 MG tablet Take 10 mg by mouth daily.    Historical Provider, MD  fluticasone (FLONASE) 50 MCG/ACT nasal spray Place 1  spray into both nostrils daily.    Historical Provider, MD  furosemide (LASIX) 20 MG tablet Take 1 tablet (20 mg total) by mouth daily. 03/15/16   Doreene Eland, MD  PROVENTIL HFA 108 (90 Base) MCG/ACT inhaler INHALE 2 PUFFS BY MOUTH EVERY 4 HOURS AS NEEDED FOR WHEEZING OR SHORTNESS OF BREATH 02/26/16   Doreene Eland, MD    Family History Family History  Problem Relation Age of Onset  . Hypertension Mother   . Diabetes Father   . Hypertension Father     Social History Social History  Substance Use Topics  . Smoking status: Current Some Day Smoker  . Smokeless tobacco: Never Used  . Alcohol use No     Allergies   Amlodipine; Lisinopril; and Shellfish allergy   Review of Systems Review of Systems  All other systems reviewed and are negative.    Physical Exam Updated Vital Signs BP (!) 149/110 (BP Location: Left Arm)   Pulse (!) 104   Temp 98.4 F (36.9 C) (Oral)   Resp 18   Ht 5\' 2"  (1.575 m)   Wt 86.2 kg   LMP 03/03/2016   SpO2 100%   BMI 34.75 kg/m   Physical Exam  Constitutional: She appears well-developed and well-nourished. No distress.  Obese female resting coughing bed in no acute discomfort.  HENT:  Head: Atraumatic.  Eyes: Conjunctivae are normal.  Neck: Neck  supple.  Cardiovascular: Normal rate and regular rhythm.   Pulmonary/Chest: Effort normal and breath sounds normal.  Abdominal: Soft. She exhibits no distension. There is tenderness (Epigastric tenderness without guarding or rebound tenderness. Negative Murphy sign, no pain at McBurney's point).  Neurological: She is alert.  Skin: No rash noted.  Psychiatric: She has a normal mood and affect.  Nursing note and vitals reviewed.    ED Treatments / Results  Labs (all labs ordered are listed, but only abnormal results are displayed) Labs Reviewed  COMPREHENSIVE METABOLIC PANEL - Abnormal; Notable for the following:       Result Value   Glucose, Bld 103 (*)    All other components within  normal limits  URINALYSIS, ROUTINE W REFLEX MICROSCOPIC - Abnormal; Notable for the following:    APPearance HAZY (*)    All other components within normal limits  LIPASE, BLOOD  CBC  I-STAT BETA HCG BLOOD, ED ([facility], [facility], [facility] ONLY)    EKG  EKG Interpretation None       Radiology No results found.  Procedures Procedures (including critical care time)  Medications Ordered in ED Medications  gi cocktail (Maalox,Lidocaine,Donnatal) (30 mLs Oral Given 03/28/16 2257)     Initial Impression / Assessment and Plan / ED Course  I have reviewed the triage vital signs and the nursing notes.  Pertinent labs & imaging results that were available during my care of the patient were reviewed by me and considered in my medical decision making (see chart for details).     BP (!) 150/96 (BP Location: Right Arm)   Pulse 86   Temp 98.4 F (36.9 C) (Oral)   Resp 16   Ht 5\' 2"  (1.575 m)   Wt 86.2 kg   LMP 03/03/2016   SpO2 100%   BMI 34.75 kg/m    Final Clinical Impressions(s) / ED Diagnoses   Final diagnoses:  Epigastric abdominal pain    New Prescriptions New Prescriptions   OMEPRAZOLE (PRILOSEC) 20 MG CAPSULE    Take 1 capsule (20 mg total) by mouth daily.   RANITIDINE (ZANTAC) 150 MG CAPSULE    Take 1 capsule (150 mg total) by mouth daily.   10:31 PM Patient with recurrent upper abdominal pain for at least a year. She does have some reproducible epigastric pain. Suspect slightly related to GERD as it often associated with eating. No right upper quadrant tenderness to suggest biliary disease. Her labs are reassuring.  11:38 PM Pt report GI cocktail did alleviate her pain.  Pt will d/c with prilosec/zantac and outpt f/u with GI as needed.  She does have a f/u appointment with her PCP on April 2nd.    Fayrene HelperBowie Ima Hafner, PA-C 03/28/16 [phone]    Jacalyn LefevreJulie Haviland, MD 03/28/16 [phone]

## 2016-03-28 NOTE — ED Triage Notes (Signed)
Pt reports LUQ abdominal pain, onset "a few months ago" but states tonight it was unbearable and unrelieved with tylenol. Denies nausea and vomiting but reports diarrhea.

## 2016-04-09 ENCOUNTER — Ambulatory Visit: Payer: Medicaid Other | Admitting: Family Medicine

## 2016-04-17 ENCOUNTER — Telehealth: Payer: Self-pay | Admitting: Family Medicine

## 2016-04-17 MED ORDER — PAROXETINE HCL 20 MG PO TABS: 20.0000 mg | ORAL_TABLET | Freq: Every day | ORAL | 1 refills | Status: DC

## 2016-04-17 NOTE — Telephone Encounter (Signed)
Pt is calling and would like to speak to Dr. Lum Babe. She said that she is having those episodes again. jw

## 2016-04-17 NOTE — Telephone Encounter (Signed)
I spoke with her; she was crying over the telephone profusely. She stated that she is having a flare of her anxiety and depression symptoms again. She was previously on Paxil. However, she self-discontinued it in 2016; she has been doing reasonably okay till the last couple of weeks. She stated she has lost interest in many things,she feels depressed. She denies any suicidal or homicidal thoughts. She feels safe at home. Her parents are home with her, and they help her care for her 32 Y/O son as well. She stated she is interested in getting back on Paxil since this had worked for her in the past. She will pick up med today at the pharmacy.  Paxil refilled. I scheduled a follow-up appointment for her tomorrow with Dr. Nelson Chimes. Strict return precaution discussed, she is advised to head straight to the ED/ dial 911 if she becomes suicidal or homicidal, she agreed with the plan and verbalized understanding. I commended her for seeking help by calling me, and she affirmed that she knows when to get help when she needs one.   I will forward message to Dr. Nelson Chimes. Please set her up with [facility] as well when she comes in tomorrow and help with Psy referral given acute worsening of her symptoms.

## 2016-04-18 ENCOUNTER — Ambulatory Visit (INDEPENDENT_AMBULATORY_CARE_PROVIDER_SITE_OTHER): Payer: Medicaid Other | Admitting: Family Medicine

## 2016-04-18 VITALS — BP 118/78 | HR 90 | Temp 98.4°F | Wt 192.8 lb

## 2016-04-18 DIAGNOSIS — F329 Major depressive disorder, single episode, unspecified: Secondary | ICD-10-CM | POA: Diagnosis present

## 2016-04-18 DIAGNOSIS — F32A Depression, unspecified: Secondary | ICD-10-CM

## 2016-04-18 DIAGNOSIS — F4323 Adjustment disorder with mixed anxiety and depressed mood: Secondary | ICD-10-CM | POA: Diagnosis not present

## 2016-04-18 MED ORDER — SERTRALINE HCL 50 MG PO TABS: 50.0000 mg | ORAL_TABLET | Freq: Every day | ORAL | 3 refills | Status: DC

## 2016-04-18 NOTE — Patient Instructions (Signed)
It was so nice to meet you today.  You were seen in clinic for symptoms of depression and anxiety.  You had noted Paxil did not previously work for you and I have prescribed you a different antidepressant to try called Zoloft.  You can take this daily as prescribed but as we discussed, the effects of antidepressants can take about 4-6 weeks to take full effect.   You can pick this up from your pharmacy.  I expect you should start feeling better once taking this for several weeks. As we also discussed, any thoughts of hurting yourself or others should be a sign to seek help immediately.    Additionally, I have provided you with a list of resources for counseling that I think will benefit you.  These all take Medicaid.  Please call and schedule an appointment to be seen by them .  In the meantime, I will reach out to our [facility] team here and have you scheduled for follow-up. Please call clinic with any questions.    Be well,  Freddrick March, MD

## 2016-04-18 NOTE — Telephone Encounter (Signed)
Thanks for the heads up Dr. Lum Babe!  I will see her today to evaluate how she's doing and set her up with [facility].

## 2016-04-18 NOTE — Progress Notes (Signed)
Subjective:   Patient ID: Debra Strong    DOB: 16-May-1985, 31 y.o. female   MRN: [id number]  CC: depression   HPI: Debra Strong is a 31 y.o. female who presents to clinic today for anxiety and depression.  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  Had started working later and later into the night.  Since then had never been able to sleep at night.  Sleeps all day.  Paxil refilled on 04/17/2016.  She has not picked it up because previously when on Paxil feels "like a zombie."  First took Paxil in 2016 and took it for about 1-2 months.  Has worked for her in the past but states the medication made her not want to do anything.  Felt numb.  Has not tried any other antidepressants.  In 2016 wanted to start over.  Quit job, moved in with her sister in [place].  Has since then moved in with parents  ~September of 2017.   Gets anxiety while driving.  Has gotten a speeding ticket almost every year.  Appetite normal.  Eats 1-2 meals a day.  Does not get enough sleep so feels tired. Stays up for hours at night laying in bed because can't fall asleep. Thoughts include childhood memories when uncle was killed, she was age 65.  Feels like she can't "put the puzzle pieces together".  Has to wake up early in AM because son has school so that tires her more.   This has been affecting her memory.  She feels she has no interest, does what she has to do for her son but anything apart from that is emotional and tiring.  Lives with 32 y/o son and parents.  Feels safe at home. Has good social support from them.  Does not have too many friends, doesn't keep in touch with old high school friends.  Keeps to herself and has a huge family so doesn't really rely on friends.  Has not had thoughts of hurting herself, denies SI/HI.  Reports she has a strong spiritual background and those thoughts do not ever cross her mind.   Interested in starting a different medication if possible.   ROS: Denies  fevers, chills, nausea, vomiting, diarrhea.   PMFSH: Pertinent past medical, surgical, family, and social history were reviewed and updated as appropriate. Smoking status reviewed.  Medications reviewed.  Objective:   BP 118/78   Pulse 90   Temp 98.4 F (36.9 C) (Oral)   Wt 192 lb 12.8 oz (87.5 kg)   LMP 04/14/2016 (Approximate)   SpO2 98%   BMI 35.26 kg/m  Vitals and nursing note reviewed.  General: 31 yo F, well nourished, well developed, in no acute distress HEENT: normocephalic, atraumatic, moist mucous membranes Neck: supple CV: regular rate and rhythm without murmurs rubs or gallops Lungs: clear to auscultation bilaterally with normal work of breathing Abdomen: soft, +bs Skin: warm, dry, no rashes  Extremities: warm and well perfused, normal tone  Assessment & Plan:   Adjustment disorder with mixed anxiety and depressed mood Patient tearful with worsening symptoms of depression. PHQ-9 today score 22 and GAD score 17.    Has self-discontinued Paxil as this worked for her in the past but made her feel like a zombie.  She is agreeable to starting a different medication today -Prescribed Zoloft 50 mg daily -Discussed that antidepressants may take 4-6 weeks to take effect and advised her to take it as prescribed -Referred to [facility] for  follow up -Additionally, have provided patient with list of  resources for outpatient psych and she will try calling one -Return precautions reviewed  Meds ordered this encounter  Medications  . sertraline (ZOLOFT) 50 MG tablet    Sig: Take 1 tablet (50 mg total) by mouth daily.    Dispense:  30 tablet    Refill:  3   Follow up: 2 weeks  Freddrick March, MD [facility], PGY-1 04/22/2016 12:09 AM

## 2016-04-18 NOTE — Progress Notes (Signed)
Patient ID: Debra Strong, female   DOB: 1985-09-08, 31 y.o.   MRN: [id number] The patient came in to see Dr. Nelson Chimes today. I also saw her briefly. She will like to try a different medication other than Paxil. Sertraline was started for Depression with Anxiety. I called her pharmacy (spoke with Byrd Hesselbach) to cancel her Paxil refill. She again denies suicidal or homicidal ideation. She stated she knows when to call for help. Set her up with a psychiatrist and initiate CBT management. I will see her back in 2-4 weeks or sooner as needed.

## 2016-04-22 NOTE — Assessment & Plan Note (Addendum)
Patient tearful with worsening symptoms of depression. PHQ-9 today score 22 and GAD score 17.    Has self-discontinued Paxil as this worked for her in the past but made her feel like a zombie.  She is agreeable to starting a different medication today -Prescribed Zoloft 50 mg daily -Discussed that antidepressants may take 4-6 weeks to take effect and advised her to take it as prescribed -Referred to [facility] for follow up -Additionally, have provided patient with list of  resources for outpatient psych and she will try calling one -Return precautions reviewed

## 2016-04-29 ENCOUNTER — Telehealth: Payer: Self-pay | Admitting: Psychology

## 2016-04-29 NOTE — Telephone Encounter (Signed)
Called to follow up on recommendations made by Dr. Nelson Chimes and Dr. Lum Babe when patient was last seen in [facility].  There was no answer and no VM.  I called twice.  Will attempt again later.

## 2016-04-29 NOTE — Telephone Encounter (Signed)
I also called to check on patient today. However, there was no response. I called her mother's number, she did not pick up either and I did not leave a message. I will attempt to call her later during the week.

## 2016-04-30 NOTE — Telephone Encounter (Signed)
Called patient today but no answer and no VM.  Will try again tomorrow.

## 2016-05-06 ENCOUNTER — Telehealth: Payer: Self-pay | Admitting: Family Medicine

## 2016-05-06 NOTE — Telephone Encounter (Signed)
I called patient's mom to check on the patient since her number on file has been disconnected. She was with her mom at the time. She stated she did not pick up her newly prescribed antidepressant since she is feeling better. She has been keeping herself busy. She is yet to schedule f/u with [facility]; she said she will call for an appointment when she is ready. She mentioned she recently got a new phone number, hence I updated her number: [phone]

## 2016-05-06 NOTE — Telephone Encounter (Signed)
Drs. Lum Babe and Nelson Chimes - please let me know if and when contact is made and if [facility] can be of further help.

## 2016-11-14 ENCOUNTER — Other Ambulatory Visit: Payer: Self-pay

## 2016-11-14 ENCOUNTER — Encounter (HOSPITAL_COMMUNITY): Payer: Self-pay | Admitting: Emergency Medicine

## 2016-11-14 ENCOUNTER — Emergency Department (HOSPITAL_COMMUNITY)
Admission: EM | Admit: 2016-11-14 | Discharge: 2016-11-14 | Disposition: A | Discharge status: Home / Self Care | Payer: Medicaid Other | Attending: Emergency Medicine | Admitting: Emergency Medicine

## 2016-11-14 DIAGNOSIS — Z91013 Allergy to seafood: Secondary | ICD-10-CM | POA: Insufficient documentation

## 2016-11-14 DIAGNOSIS — Z79899 Other long term (current) drug therapy: Secondary | ICD-10-CM | POA: Diagnosis not present

## 2016-11-14 DIAGNOSIS — I10 Essential (primary) hypertension: Secondary | ICD-10-CM | POA: Diagnosis not present

## 2016-11-14 DIAGNOSIS — L989 Disorder of the skin and subcutaneous tissue, unspecified: Secondary | ICD-10-CM | POA: Insufficient documentation

## 2016-11-14 DIAGNOSIS — F17200 Nicotine dependence, unspecified, uncomplicated: Secondary | ICD-10-CM | POA: Diagnosis not present

## 2016-11-14 DIAGNOSIS — J45909 Unspecified asthma, uncomplicated: Secondary | ICD-10-CM | POA: Insufficient documentation

## 2016-11-14 DIAGNOSIS — R21 Rash and other nonspecific skin eruption: Secondary | ICD-10-CM | POA: Diagnosis present

## 2016-11-14 MED ORDER — TRIAMCINOLONE ACETONIDE 0.1 % EX CREA: 1.0000 "application " | TOPICAL_CREAM | Freq: Two times a day (BID) | CUTANEOUS | 0 refills | Status: DC

## 2016-11-14 NOTE — ED Provider Notes (Signed)
[facility]ENC[facility] Provider Note   CSN: [id number] Arrival date & time: 11/14/16  2007     History   Chief Complaint Chief Complaint  Patient presents with  . Rash    HPI Debra Strong is a 31 y.o. female.  HPI   Debra Strong is a 31 year old female with a history of asthma, hypertension who presents emergency department for evaluation of bumps on her right wrist and ankle.  Patient states that she noticed 2 bumps on her anterior distal forearm and a bump on her right ankle approximately 4 days ago.  She states that the bumps are itchy and mildly painful.  He notes that there was some mild clear drainage coming from one after she had scratched it.  Reports that she has applied over-the-counter antibiotic cream to the bumps.  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  Thinks she might have been bit.  Denies new lotions, soaps, detergents or skin products.  No contacts at home with similar spots.  No recent viral illness. She denies fever, nausea/vomiting, numbness, weakness, rash elsewhere.  Past Medical History:  Diagnosis Date  . Asthma   . Hypertension   . Pregnancy induced hypertension     Patient Active Problem List   Diagnosis Date Noted  . Peripheral edema 08/25/2015  . Tobacco abuse counseling 05/30/2015  . Adjustment disorder with mixed anxiety and depressed mood 03/04/2014  . Essential hypertension, benign 09/10/2012  . Asthma 08/29/2006    Past Surgical History:  Procedure Laterality Date  . MOUTH SURGERY    . NO PAST SURGERIES      OB History    Gravida Para Term Preterm AB Living   4 0   0 3 1   SAB TAB Ectopic Multiple Live Births   3 0             Home Medications    Prior to Admission medications   Medication Sig Start Date End Date Taking? Authorizing Provider  cetirizine (ZYRTEC) 10 MG tablet Take 10 mg by mouth daily.    [provider]    fluticasone (FLONASE) 50 MCG/ACT nasal spray Place 1 spray into both nostrils daily.    [provider]  furosemide (LASIX) 20 MG tablet Take 1 tablet (20 mg total) by mouth daily. 03/15/16   Doreene ElandEniola, Kehinde T, MD  omeprazole (PRILOSEC) 20 MG capsule Take 1 capsule (20 mg total) by mouth daily. 03/28/16   Fayrene Helperran, Bowie, PA-C  PROVENTIL HFA 108 (90 Base) MCG/ACT inhaler INHALE 2 PUFFS BY MOUTH EVERY 4 HOURS AS NEEDED FOR WHEEZING OR SHORTNESS OF BREATH 02/26/16   Doreene ElandEniola, Kehinde T, MD  ranitidine (ZANTAC) 150 MG capsule Take 1 capsule (150 mg total) by mouth daily. 03/28/16   Fayrene Helperran, Bowie, PA-C  sertraline (ZOLOFT) 50 MG tablet Take 1 tablet (50 mg total) by mouth daily. 04/18/16   Freddrick MarchAmin, Yashika, MD  triamcinolone cream (KENALOG) 0.1 % Apply 1 application 2 (two) times daily topically. 11/14/16   Kellie ShropshireShrosbree, Makaveli Hoard J, PA-C    Family History Family History  Problem Relation Age of Onset  . Hypertension Mother   . Diabetes Father   . Hypertension Father     Social History Social History   Tobacco Use  . Smoking status: Current Some Day Smoker  . Smokeless tobacco: Never Used  Substance Use Topics  . Alcohol use: No    Alcohol/week: 0.0 oz  .  Drug use: No     Allergies   Amlodipine; Lisinopril; and Shellfish allergy   Review of Systems Review of Systems  Constitutional: Negative for chills, fatigue and fever.  Musculoskeletal: Negative for arthralgias, gait problem and joint swelling.  Skin: Positive for rash (a few bumps on right wrist and ankle). Negative for color change and wound.  Neurological: Negative for weakness and numbness.     Physical Exam Updated Vital Signs BP (!) 146/109 (BP Location: Right Arm)   Pulse 83   Temp 99.3 F (37.4 C) (Oral)   Resp 16   Ht 5\' 2"  (1.575 m)   Wt 86.2 kg (190 lb)   LMP 10/17/2016   SpO2 97%   BMI 34.75 kg/m   Physical Exam  Constitutional: She is oriented to person, place, and time. She appears well-developed and  well-nourished. No distress.  HENT:  Head: Normocephalic and atraumatic.  Eyes: Right eye exhibits no discharge. Left eye exhibits no discharge.  Pulmonary/Chest: Effort normal. No respiratory distress.  Musculoskeletal:  Full ROM of right wrist and elbow.  Grip strength 5/5. Radial pulses 2+ bilaterally.  Full ROM of right ankle.  5/5 strength in right ankle.  DP pulses 2+ bilaterally.  Neurological: She is alert and oriented to person, place, and time. Coordination normal.  Distal sensation to light touch intact in bilateral upper and lower extremities.  Skin: Skin is warm and dry. Capillary refill takes less than 2 seconds. She is not diaphoretic.  Right distal anterior forearm with 2 isolated erythematous raised bumps which are approximately 5 mm in diameter.  No overlying warmth, induration, tenderness or drainage.  One raised 5 mm erythematous circular bump noted at the anterior distal right shin.  It is not draining, no surrounding erythema, warmth, induration.  Psychiatric: She has a normal mood and affect. Her behavior is normal.  Nursing note and vitals reviewed.    ED Treatments / Results  Labs (all labs ordered are listed, but only abnormal results are displayed) Labs Reviewed - No data to display  EKG  EKG Interpretation None       Radiology No results found.  Procedures Procedures (including critical care time)  Medications Ordered in ED Medications - No data to display   Initial Impression / Assessment and Plan / ED Course  I have reviewed the triage vital signs and the nursing notes.  Pertinent labs & imaging results that were available during my care of the patient were reviewed by me and considered in my medical decision making (see chart for details).    Patient presents with what appears to be 3 bug bites, two on the right forearm and one on the right shin.  No surrounding erythema, warmth, induration, drainiage to suggest infection.  Patient is  afebrile, nontoxic-appearing.  Will treat with steroid cream for itching.  We have discussed return precautions and patient agrees.  Her blood pressure was elevated in the ER today, discussed having this rechecked at PCP office and she agrees and voices understanding.  Final Clinical Impressions(s) / ED Diagnoses   Final diagnoses:  Bumps on skin    ED Discharge Orders        Ordered    triamcinolone cream (KENALOG) 0.1 %  2 times daily     11/14/16 2222       Kellie ShropshireShrosbree, Frandy Basnett J, PA-C 11/15/16 0130    Mancel BaleWentz, Elliott, MD 11/23/16 [phone]

## 2016-11-14 NOTE — Discharge Instructions (Signed)
I have written you a prescription for a steroid cream.  Please apply this to the bumps on your skin twice a day.  This will help with itching.  Please take 25 mg Benadryl at night to help with itching.  You can get this over-the-counter.  This medicine will make you drowsy, please do not drive, work or drink alcohol taking this medicine.  Your blood pressure was elevated in the ER today.  Please have this rechecked at your primary care office.  Return to the emergency department if you have worsening redness and swelling up the arm or leg, fever greater than 100.4 F or have any new or worsening symptoms.

## 2016-11-14 NOTE — ED Notes (Signed)
Pt requested doctor's note for her workplace tomorrow.

## 2016-11-14 NOTE — ED Triage Notes (Signed)
Pt c/o multiple blisters that started on her ankles on Monday, noticed some on her hands today. Denies pain, just itching.

## 2017-01-11 ENCOUNTER — Other Ambulatory Visit: Payer: Self-pay | Admitting: Family Medicine

## 2017-01-11 DIAGNOSIS — I10 Essential (primary) hypertension: Secondary | ICD-10-CM

## 2017-01-11 DIAGNOSIS — R609 Edema, unspecified: Secondary | ICD-10-CM

## 2017-01-13 ENCOUNTER — Telehealth: Payer: Self-pay | Admitting: *Deleted

## 2017-01-13 NOTE — Telephone Encounter (Signed)
LM for patient to call back.  Please assist her in making an appointment to see PCP. Tulani Kidney,CMA

## 2017-01-13 NOTE — Telephone Encounter (Signed)
-----   Message from Doreene ElandKehinde T Eniola, MD sent at 01/13/2017  8:45 AM EST ----- Please help patient schedule appointment for HTN and Lasix refill. We will not refill Lasix without BP and edema assessment.

## 2017-01-21 ENCOUNTER — Ambulatory Visit: Payer: Medicaid Other | Admitting: Family Medicine

## 2017-01-21 ENCOUNTER — Other Ambulatory Visit: Payer: Self-pay

## 2017-01-21 ENCOUNTER — Encounter: Payer: Self-pay | Admitting: Family Medicine

## 2017-01-21 DIAGNOSIS — F4323 Adjustment disorder with mixed anxiety and depressed mood: Secondary | ICD-10-CM

## 2017-01-21 DIAGNOSIS — I10 Essential (primary) hypertension: Secondary | ICD-10-CM

## 2017-01-21 DIAGNOSIS — R609 Edema, unspecified: Secondary | ICD-10-CM

## 2017-01-21 NOTE — Progress Notes (Signed)
   CC: med refills, anxiety  HPI Med refills - needs lasix. She uses this for peripheral edema. None present today. Been on this for over 1 year.  No history of congestive heart failure.  HTN - taking lasix 20mg  every morning for peripheral edema. Hx of allergies to some antihypertensives, she cannot recall the specifics. No CHF history.   Anxiety - unable to tolerate SSRI in the past due to excessive sleepiness. Didn't remember to pick son up from daycare due to sleepiness.  Does not recall the name of the SSRI she tried, however it appears to be Zoloft in the chart.  Presenting Issue: anxiety  Report of symptoms: anxiety and isolating herself. Feels physically tired and tight.   Duration of CURRENT symptoms: years Age of onset of first mood disturbance: 2013, after her FOB left   Impact on function: quit her job  Psychiatric History - Diagnoses: anxiety  - Hospitalizations:  none - Pharmacotherapy: failed an unclear SSRI 2/2 excessive sleepiness - Outpatient therapy: never tried   Family history of psychiatric issues: mom with anxiety   Current and history of substance use: current use of THC   Other: none  (Consider trauma, interpersonal violence)  PHQ-9:18 GAD7:18  ROS: denies CP, SOB, abd pain, dysuria, changes in BM.    CC, SH/smoking status, and VS noted  Objective: BP 130/80   Pulse 90   Temp 98 F (36.7 C) (Oral)   Wt 197 lb (89.4 kg)   LMP 01/15/2017   SpO2 99%   BMI 36.03 kg/m  Gen: NAD, alert, cooperative, and pleasant. HEENT: NCAT, EOMI, PERRL CV: RRR, no murmur Resp: CTAB, no wheezes, non-labored Ext: No edema, warm Neuro: Alert and oriented, Speech clear, No gross deficits  Assessment and plan:  Essential hypertension, benign Discontinue lasix as BP normal and no hx of CHF. Consider alterative anti HTN if BP were to become high at next visit or at home.   Peripheral edema Discontinue Lasix.  Recommend lifestyle modifications such as  elevation and compression stockings.  No edema on exam, patient is comfortable with stopping Lasix at this time.  Adjustment disorder with mixed anxiety and depressed mood Patient provided with listening ear and motivational interviewing. Elects not to try any medications today. [facility]. [facility], sent a message to integrated care pool to please give her a call. Return to see PCP at 1 month.    Loni MuseKate Lucile Hillmann, MD, PGY2 01/23/2017 1:58 PM

## 2017-01-21 NOTE — Patient Instructions (Signed)
It was a pleasure to see you today! Thank you for choosing [facility] for your primary care. Debra Strong was seen for anxiety, swelling.   Our plans for today were:  Stop your lasix and recheck your blood pressure on occasion.   Expect a phone call from the counselor in the next few days.   You should return to our clinic to see Dr. Lum BabeEniola in 4 weeks for blood pressure.   Best,  Dr. Chanetta Marshallimberlake

## 2017-01-21 NOTE — Assessment & Plan Note (Signed)
Discontinue lasix as BP normal and no hx of CHF. Consider alterative anti HTN if BP were to become high at next visit or at home.

## 2017-01-23 NOTE — Assessment & Plan Note (Signed)
Discontinue Lasix.  Recommend lifestyle modifications such as elevation and compression stockings.  No edema on exam, patient is comfortable with stopping Lasix at this time.

## 2017-01-23 NOTE — Telephone Encounter (Signed)
Patient made appt for 02/18/17 with PCP. Jazmin Hartsell,CMA

## 2017-01-23 NOTE — Assessment & Plan Note (Signed)
Patient provided with listening ear and motivational interviewing. Elects not to try any medications today. [facility], sent a message to integrated care pool to please give her a call. Return to see PCP at 1 month.

## 2017-01-27 ENCOUNTER — Encounter: Payer: Self-pay | Admitting: Psychology

## 2017-01-27 NOTE — Progress Notes (Signed)
At the request of Dr. Chanetta Marshallimberlake, I called this patient to set up an appointment in Integrated [facility]. Her voicemail is not set up so I could not leave a message for her.

## 2017-02-18 ENCOUNTER — Ambulatory Visit: Payer: Medicaid Other | Admitting: Family Medicine

## 2017-05-21 ENCOUNTER — Inpatient Hospital Stay (HOSPITAL_COMMUNITY)
Admission: AD | Admit: 2017-05-21 | Discharge: 2017-05-22 | Disposition: A | Discharge status: Home / Self Care | Payer: Medicaid Other | Source: Ambulatory Visit | Attending: Obstetrics and Gynecology | Admitting: Obstetrics and Gynecology

## 2017-05-21 ENCOUNTER — Encounter (HOSPITAL_COMMUNITY): Payer: Self-pay

## 2017-05-21 DIAGNOSIS — I10 Essential (primary) hypertension: Secondary | ICD-10-CM | POA: Insufficient documentation

## 2017-05-21 DIAGNOSIS — Z888 Allergy status to other drugs, medicaments and biological substances status: Secondary | ICD-10-CM | POA: Insufficient documentation

## 2017-05-21 DIAGNOSIS — Z8249 Family history of ischemic heart disease and other diseases of the circulatory system: Secondary | ICD-10-CM | POA: Insufficient documentation

## 2017-05-21 DIAGNOSIS — K589 Irritable bowel syndrome without diarrhea: Secondary | ICD-10-CM | POA: Insufficient documentation

## 2017-05-21 DIAGNOSIS — Z91013 Allergy to seafood: Secondary | ICD-10-CM | POA: Insufficient documentation

## 2017-05-21 DIAGNOSIS — N898 Other specified noninflammatory disorders of vagina: Secondary | ICD-10-CM | POA: Diagnosis present

## 2017-05-21 DIAGNOSIS — N760 Acute vaginitis: Secondary | ICD-10-CM | POA: Diagnosis not present

## 2017-05-21 DIAGNOSIS — F17200 Nicotine dependence, unspecified, uncomplicated: Secondary | ICD-10-CM | POA: Diagnosis not present

## 2017-05-21 DIAGNOSIS — Z9889 Other specified postprocedural states: Secondary | ICD-10-CM | POA: Insufficient documentation

## 2017-05-21 DIAGNOSIS — F419 Anxiety disorder, unspecified: Secondary | ICD-10-CM | POA: Insufficient documentation

## 2017-05-21 DIAGNOSIS — Z833 Family history of diabetes mellitus: Secondary | ICD-10-CM | POA: Diagnosis not present

## 2017-05-21 DIAGNOSIS — B9689 Other specified bacterial agents as the cause of diseases classified elsewhere: Secondary | ICD-10-CM

## 2017-05-21 LAB — URINALYSIS, ROUTINE W REFLEX MICROSCOPIC
BILIRUBIN URINE: NEGATIVE
Glucose, UA: NEGATIVE mg/dL
Hgb urine dipstick: NEGATIVE
KETONES UR: NEGATIVE mg/dL
LEUKOCYTES UA: NEGATIVE
NITRITE: NEGATIVE
Protein, ur: NEGATIVE mg/dL
SPECIFIC GRAVITY, URINE: 1.012 (ref 1.005–1.030)
pH: 6 (ref 5.0–8.0)

## 2017-05-21 LAB — POCT PREGNANCY, URINE: PREG TEST UR: NEGATIVE

## 2017-05-21 NOTE — MAU Note (Signed)
Pt states LMP 04/14/2017. States she had a negative pregnancy test. Pt reports some vaginal discharge and itchiness for several weeks. States she thought she was getting a yeast infection-tried monistat. Pt now reporting a yellow/brown fishy discharge. Pt reports she is under a lot of stress and reporting increasing anxiety and IBS symptoms.

## 2017-05-21 NOTE — MAU Provider Note (Signed)
Chief Complaint:  Vaginal Discharge   First Provider Initiated Contact with Patient 05/21/17 2328      HPI: Debra Strong is a 32 y.o. [id number] who presents to maternity admissions reporting vaginal discharge with itching.  Period is late but she is pretty sure it is due to stress.  Recent breakup was hard for her.  Not using birth control.  UPT was negative at home. . She reports no vaginal bleeding, urinary symptoms, h/a, dizziness, n/v, or fever/chills.    Vaginal Discharge  The patient's primary symptoms include genital itching, missed menses and vaginal discharge. The patient's pertinent negatives include no genital lesions, genital odor, pelvic pain or vaginal bleeding. This is a new problem. The current episode started in the past 7 days. The problem occurs constantly. The problem has been unchanged. She is not pregnant. Pertinent negatives include no abdominal pain, back pain, chills, constipation, diarrhea, dysuria, fever, flank pain, frequency, headaches, nausea or vomiting. The vaginal discharge was yellow. There has been no bleeding. She has not been passing clots. She has not been passing tissue. Nothing aggravates the symptoms. She has tried nothing for the symptoms. She uses nothing for contraception.   RN Note: Pt states LMP 04/14/2017. States she had a negative pregnancy test. Pt reports some vaginal discharge and itchiness for several weeks. States she thought she was getting a yeast infection-tried monistat. Pt now reporting a yellow/brown fishy discharge. Pt reports she is under a lot of stress and reporting increasing anxiety and IBS symptoms  Past Medical History: Past Medical History:  Diagnosis Date  . Asthma   . Hypertension   . Pregnancy induced hypertension     Past obstetric history: OB History  Gravida Para Term Preterm AB Living  4 0   0 3 1  SAB TAB Ectopic Multiple Live Births  3 0          # Outcome Date GA Lbr Len/2nd Weight Sex Delivery Anes PTL Lv   4 SAB 08/17/09          3 Gravida      Vag-Spont     2 SAB           1 SAB              Birth Comments: System Generated. Please review and update pregnancy details.    Past Surgical History: Past Surgical History:  Procedure Laterality Date  . MOUTH SURGERY    . NO PAST SURGERIES      Family History: Family History  Problem Relation Age of Onset  . Hypertension Mother   . Diabetes Father   . Hypertension Father     Social History: Social History   Tobacco Use  . Smoking status: Current Some Day Smoker  . Smokeless tobacco: Never Used  Substance Use Topics  . Alcohol use: No    Alcohol/week: 0.0 oz  . Drug use: No    Allergies:  Allergies  Allergen Reactions  . Amlodipine Palpitations    Pt states she was taking anxiety medication as well and does not know which was causing the heart palpations.  . Lisinopril Nausea Only and Other (See Comments)    Dizziness  . Shellfish Allergy Anaphylaxis    Meds:  No medications prior to admission.    I have reviewed patient's Past Medical Hx, Surgical Hx, Family Hx, Social Hx, medications and allergies.  ROS:  Review of Systems  Constitutional: Negative for chills and fever.  Gastrointestinal: Negative for abdominal  pain, constipation, diarrhea, nausea and vomiting.  Genitourinary: Positive for missed menses and vaginal discharge. Negative for dysuria, flank pain, frequency and pelvic pain.  Musculoskeletal: Negative for back pain.  Neurological: Negative for headaches.   Other systems negative     Physical Exam   Patient Vitals for the past 24 hrs:  BP Temp Temp src Pulse Resp SpO2 Height Weight  05/22/17 0107 (!) 150/92 - - - - - - -  05/21/17 2326 134/89 - - - - - - -  05/21/17 2258 (!) 160/101 98.4 F (36.9 C) Oral 96 18 100 %  (1.575 m) 201 lb (91.2 kg)   Constitutional: Well-developed, well-nourished female in no acute distress.  Cardiovascular: normal rate and rhythm Respiratory: normal  effort, no distress.  GI: Abd soft, non-tender.  Nondistended.  No rebound, No guarding.  MS: Extremities nontender, no edema, normal ROM Neurologic: Alert and oriented x 4.   Grossly nonfocal. GU: Neg CVAT. Skin:  Warm and Dry Psych:  Affect appropriate.  PELVIC EXAM: EGBUS normal with scant white creamy discharge, vaginal walls and external genitalia normal   Labs: Results for orders placed or performed during the hospital encounter of 05/21/17 (from the past 24 hour(s))  Urinalysis, Routine w reflex microscopic     Status: None   Collection Time: 05/21/17 10:51 PM  Result Value Ref Range   Color, Urine YELLOW YELLOW   APPearance CLEAR CLEAR   Specific Gravity, Urine 1.012 1.005 - 1.030   pH 6.0 5.0 - 8.0   Glucose, UA NEGATIVE NEGATIVE mg/dL   Hgb urine dipstick NEGATIVE NEGATIVE   Bilirubin Urine NEGATIVE NEGATIVE   Ketones, ur NEGATIVE NEGATIVE mg/dL   Protein, ur NEGATIVE NEGATIVE mg/dL   Nitrite NEGATIVE NEGATIVE   Leukocytes, UA NEGATIVE NEGATIVE  Pregnancy, urine POC     Status: None   Collection Time: 05/21/17 11:16 PM  Result Value Ref Range   Preg Test, Ur NEGATIVE NEGATIVE  Wet prep, genital     Status: Abnormal   Collection Time: 05/21/17 11:40 PM  Result Value Ref Range   Yeast Wet Prep HPF POC NONE SEEN NONE SEEN   Trich, Wet Prep NONE SEEN NONE SEEN   Clue Cells Wet Prep HPF POC PRESENT (A) NONE SEEN   WBC, Wet Prep HPF POC MODERATE (A) NONE SEEN   Sperm NONE SEEN       Imaging:  No results found.  MAU Course/MDM: I have ordered labs as follows: UA and wet prep. GC/Chlamydia also sent Imaging ordered: none Results reviewed. Wet prep showed bacterial vaginosis..   Treatments in MAU included none.   Discussed need to call primary doctor for evaluation of hypertension treatment Pt stable at time of discharge.  Assessment: 1. Bacterial vaginosis   2. Essential hypertension, benign     Plan: Discharge home Recommend Call Dr Lum Babe tomorrow for  appt Rx sent for Flagyl  for bacterial vaginosis Discussed needs to schedule GYN appt to discuss contraception  Follow-up Information    Doreene Eland, MD. Schedule an appointment as soon as possible for a visit.   Specialty:  Family Medicine Contact information: [address] [place] [address] [phone]           Encouraged to return here or to other Urgent Care/ED if she develops worsening of symptoms, increase in pain, fever, or other concerning symptoms.   Wynelle Bourgeois CNM, MSN Certified Nurse-Midwife 05/22/2017 2:02 AM

## 2017-05-22 LAB — GC/CHLAMYDIA PROBE AMP (~~LOC~~) NOT AT ARMC
Chlamydia: NEGATIVE
Neisseria Gonorrhea: NEGATIVE

## 2017-05-22 LAB — WET PREP, GENITAL
SPERM: NONE SEEN
Trich, Wet Prep: NONE SEEN
YEAST WET PREP: NONE SEEN

## 2017-05-22 MED ORDER — METRONIDAZOLE 500 MG PO TABS: 500.0000 mg | ORAL_TABLET | Freq: Two times a day (BID) | ORAL | 0 refills | Status: AC

## 2017-05-22 NOTE — Discharge Instructions (Signed)

## 2017-09-12 ENCOUNTER — Telehealth: Payer: Self-pay | Admitting: Family Medicine

## 2017-09-12 ENCOUNTER — Ambulatory Visit (INDEPENDENT_AMBULATORY_CARE_PROVIDER_SITE_OTHER): Payer: Medicaid Other | Admitting: Family Medicine

## 2017-09-12 ENCOUNTER — Encounter: Payer: Self-pay | Admitting: Family Medicine

## 2017-09-12 ENCOUNTER — Other Ambulatory Visit: Payer: Self-pay

## 2017-09-12 VITALS — BP 112/68 | HR 72 | Temp 98.3°F | Wt 201.0 lb

## 2017-09-12 DIAGNOSIS — N898 Other specified noninflammatory disorders of vagina: Secondary | ICD-10-CM | POA: Diagnosis present

## 2017-09-12 LAB — POCT WET PREP (WET MOUNT)
CLUE CELLS WET PREP WHIFF POC: POSITIVE
TRICHOMONAS WET PREP HPF POC: ABSENT

## 2017-09-12 MED ORDER — METRONIDAZOLE 500 MG PO TABS
500.0000 mg | ORAL_TABLET | Freq: Two times a day (BID) | ORAL | 0 refills | Status: DC
Start: 2017-09-12 — End: 2017-10-09

## 2017-09-12 MED ORDER — ALBUTEROL SULFATE HFA 108 (90 BASE) MCG/ACT IN AERS: INHALATION_SPRAY | RESPIRATORY_TRACT | 4 refills | Status: DC

## 2017-09-12 NOTE — Telephone Encounter (Signed)
Called patient to inform of wet prep results although no answer. Got in touch with mother who stated she will have patient return my call.  She has bacterial vaginosis. I called in an antibiotic to her pharmacy. She should take the entire of the prescription over 14 days as directed.   Ellwood Dense, DO PGY-2,  Family Medicine 09/12/2017 10:56 AM

## 2017-09-12 NOTE — Progress Notes (Signed)
  Subjective:   Patient ID: Debra Strong    DOB: [birth year], 32 y.o. female   MRN: [id number]  Debra Strong is a 32 y.o. female with a history of HTN, asthma, adjustment d/o here for   VAGINAL DISCHARGE  Having vaginal discharge for few weeks. States it is similar to a previous presentation of BV. Has been using different pads than usual and thinks she may have some irritation from them. Endorses fishy odor. Discharge consistency: thin, more than usual Discharge color: light brown Medications tried: no  Recent antibiotic use: no Sex in last month: yes Possible STD exposure:no  Symptoms Fever: no Dysuria:no Vaginal bleeding: no Abdomen or Pelvic pain: no Back pain: not new Genital sores or ulcers:no Rash: no Pain during sex: irritation Missed menstrual period: no, LMP couple weeks ago  Review of Systems:  Per HPI.  PMFSH, medications and smoking status reviewed.  Objective:   BP 112/68   Pulse 72   Temp 98.3 F (36.8 C) (Oral)   Wt 201 lb (91.2 kg)   LMP 08/25/2017 (Approximate)   SpO2 99%   BMI 36.76 kg/m  Vitals and nursing note reviewed.  General: overweight female, in no acute distress with non-toxic appearance CV: regular rate and rhythm without murmurs, rubs, or gallops Lungs: clear to auscultation bilaterally with normal work of breathing Abdomen: soft, non-tender, normoactive bowel sounds GYN:  External genitalia within normal limits.  Vaginal mucosa pink, moist, normal rugae.  Nonfriable cervix without lesions, no discharge or bleeding noted on speculum exam.  No cervical motion tenderness.  Skin: warm, dry, no rashes or lesions Extremities: warm and well perfused, normal tone MSK: ROM grossly intact, strength intact, gait normal Neuro: Alert and oriented, speech normal  Assessment & Plan:   Vaginal discharge Wet prep obtained today, consistent with BV. Will call with results and treat accordingly. No concern for STIs today, will defer  GC/CT, HIV, RPR.  Orders Placed This Encounter  Procedures  . POCT Wet Prep [facility])   Meds ordered this encounter  Medications  . albuterol (PROVENTIL HFA) 108 (90 Base) MCG/ACT inhaler    Sig: INHALE 2 PUFFS BY MOUTH EVERY 4 HOURS AS NEEDED FOR WHEEZING OR SHORTNESS OF BREATH    Dispense:  13.4 g    Refill:  4  . metroNIDAZOLE (FLAGYL) 500 MG tablet    Sig: Take 1 tablet (500 mg total) by mouth 2 (two) times daily.    Dispense:  14 tablet    Refill:  0    Ellwood Dense, DO PGY-2, [facility] 09/12/2017 10:52 AM

## 2017-09-12 NOTE — Assessment & Plan Note (Addendum)
Wet prep obtained today, consistent with BV. Will call with results and treat accordingly. No concern for STIs today, will defer GC/CT, HIV, RPR.

## 2017-09-12 NOTE — Patient Instructions (Signed)
It was great to see you!  Our plans for today:  - We are obtaining a wet prep. We will call you with these results and if we need to send in any medications. - We refilled your albuterol inhaler.  Take care and seek immediate care sooner if you develop any concerns.   Dr. [facility]

## 2017-09-12 NOTE — Telephone Encounter (Signed)
Patient returned call and was given results and information. Ples Specter, RN [facility] RN)

## 2017-10-09 ENCOUNTER — Telehealth: Payer: Self-pay | Admitting: *Deleted

## 2017-10-09 ENCOUNTER — Other Ambulatory Visit: Payer: Self-pay | Admitting: Family Medicine

## 2017-10-09 MED ORDER — METRONIDAZOLE 500 MG PO TABS: 500.0000 mg | ORAL_TABLET | Freq: Two times a day (BID) | ORAL | 0 refills | Status: DC

## 2017-10-09 NOTE — Telephone Encounter (Signed)
Pt states that she took all the meds for BV, did throw up a couple of days but took whole Rx.  She states that after her period she is having the same symptoms again.  They are slight discharge, "fishy odor" and some itching.  She wonders if she can get more meds without being seen again. Fleeger, Maryjo Rochester, CMA

## 2017-10-09 NOTE — Telephone Encounter (Signed)
Gave refill of metronidazole. She should be seen if her symptoms continue after finishing this course of antibiotics. Encourage no scented tampons or pads as this could predispose her to getting BV. Happy to talk to her should she have questions.

## 2017-10-10 NOTE — Telephone Encounter (Signed)
Attempted to contact patient, "number is not in service." If she calls back inform her of her rx to pharmacy and pcp recommendations below.

## 2017-10-23 ENCOUNTER — Telehealth: Payer: Self-pay | Admitting: Family Medicine

## 2017-10-23 NOTE — Telephone Encounter (Signed)
Pt would like for Dr. Lum Babe to call her to discuss some issues she is having with her medication. Pt states she is no longer able to stay focused and would like to possibly change her medication. I offered her multiple appointments but they did not work with her work schedule. The best contact number is [phone].

## 2017-10-23 NOTE — Telephone Encounter (Signed)
Will forward to MD. Vyla Pint,CMA  

## 2017-10-23 NOTE — Telephone Encounter (Signed)
I called and spoke with the patient. She described issues with focusing and forgetfulness, ongoing for few days to weeks. I recommended [facility] appointment and referral to Psychiatry for ADHD evaluation.  I will place referral.    [facility] team CMA please help her schedule [facility] appointment. Thanks.

## 2017-10-24 NOTE — Telephone Encounter (Signed)
Tried calling patient but voicemail is not set up.  Will continue to try and reach.  Jazmin Hartsell,CMA

## 2017-10-24 NOTE — Progress Notes (Signed)
  Service : Integrated Behavioral Health   Received call from patient stating Dr. Lum Babe referred her to [facility].  Patient wanting to know what to expect when she comes in for a [facility] appointment. LCSW explained Integrated [employer] and process.  Patient interested in scheduling a [facility] appointment on the same day she comes in to see Dr. Lum Babe. Plan:  Patient will call front office to schedule both appointments on the same day.  Sammuel Hines, LCSW Behavioral Health Clinician [facility]   [phone] 11:12 AM

## 2018-01-30 ENCOUNTER — Emergency Department (HOSPITAL_COMMUNITY)
Admission: EM | Admit: 2018-01-30 | Discharge: 2018-01-30 | Disposition: A | Discharge status: Home / Self Care | Payer: Medicaid Other | Attending: Emergency Medicine | Admitting: Emergency Medicine

## 2018-01-30 ENCOUNTER — Other Ambulatory Visit: Payer: Self-pay

## 2018-01-30 ENCOUNTER — Encounter (HOSPITAL_COMMUNITY): Payer: Self-pay | Admitting: *Deleted

## 2018-01-30 DIAGNOSIS — M542 Cervicalgia: Secondary | ICD-10-CM | POA: Diagnosis present

## 2018-01-30 DIAGNOSIS — F17210 Nicotine dependence, cigarettes, uncomplicated: Secondary | ICD-10-CM | POA: Diagnosis not present

## 2018-01-30 DIAGNOSIS — M25511 Pain in right shoulder: Secondary | ICD-10-CM | POA: Diagnosis not present

## 2018-01-30 DIAGNOSIS — M25512 Pain in left shoulder: Secondary | ICD-10-CM | POA: Diagnosis not present

## 2018-01-30 DIAGNOSIS — Z79899 Other long term (current) drug therapy: Secondary | ICD-10-CM | POA: Insufficient documentation

## 2018-01-30 DIAGNOSIS — I10 Essential (primary) hypertension: Secondary | ICD-10-CM | POA: Insufficient documentation

## 2018-01-30 DIAGNOSIS — G8929 Other chronic pain: Secondary | ICD-10-CM

## 2018-01-30 DIAGNOSIS — J45909 Unspecified asthma, uncomplicated: Secondary | ICD-10-CM | POA: Insufficient documentation

## 2018-01-30 MED ORDER — METHOCARBAMOL 500 MG PO TABS: 500.0000 mg | ORAL_TABLET | Freq: Two times a day (BID) | ORAL | 0 refills | Status: DC

## 2018-01-30 NOTE — ED Triage Notes (Signed)
Pt reports she has been having bilateral shoulder and neck pain on and off for a year. Uses warm compresses for relief.
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
## 2018-01-30 NOTE — Discharge Instructions (Signed)
Please see the information and instructions below regarding your visit.  Your diagnoses today include:  1. Cervicalgia   2. Chronic pain of both shoulders    There are many causes of shoulder pain.  Fortunately, your examination is very reassuring.  He will need further work-up with your primary care provider's office however.  Tests performed today include: See side panel of your discharge paperwork for testing performed today. Vital signs are listed at the bottom of these instructions.   Medications prescribed:    Take any prescribed medications only as prescribed, and any over the counter medications only as directed on the packaging.  You are prescribed Robaxin, a muscle relaxant. Some common side effects of this medication include:  Feeling sleepy.  Dizziness. Take care upon going from a seated to a standing position.  Dry mouth.  Feeling tired or weak.  Hard stools (constipation).  Upset stomach. These are not all of the side effects that may occur. If you have questions about side effects, call your doctor. Call your primary care provider for medical advice about side effects.  This medication can be sedating. Only take this medication as needed. Please do not combine with alcohol. Do not drive or operate machinery while taking this medication.   This medication can interact with some other medications. Make sure to tell any provider you are taking this medication before they prescribe you a new medication.   I recommend Ibuprofen, a non-steroidal anti-inflammatory agent (NSAID) for pain. You may take 600 mg every 6 hours as needed for pain. If still requiring this medication around the clock for acute pain after 10 days, please see your primary healthcare provider.  Women who are pregnant, breastfeeding, or planning on becoming pregnant should not take non-steroidal anti-inflammatories such as Advil and Aleve. Tylenol is a safe over the counter pain reliever in pregnant  women.  You may combine this medication with Tylenol, 650 mg every 6 hours, so you are receiving something for pain every 3 hours.  This is not a long-term medication unless under the care and direction of your primary provider. Taking this medication long-term and not under the supervision of a healthcare provider could increase the risk of stomach ulcers, kidney problems, and cardiovascular problems such as high blood pressure.   Home care instructions:  Please follow any educational materials contained in this packet.   Follow-up instructions: Please follow-up with your primary care provider in 2 weeks for further evaluation of your symptoms if they are not completely improved.   Please follow up with orthopedics as needed.   Return instructions:  Please return to the Emergency Department if you experience worsening symptoms.  Please return to the emergency department if you develop any weakness or numbness in your extremities, numbness of the groin, loss of bowel or bladder control, fevers with neck pain.  Please return if you have any other emergent concerns.  Additional Information:   Your vital signs today were: BP (!) 152/100 (BP Location: Left Arm)    Pulse (!) 104    Temp 98.7 F (37.1 C) (Oral)    Resp 18    LMP 01/11/2018    SpO2 100%  If your blood pressure (BP) was elevated on multiple readings during this visit above 130 for the top number or above 80 for the bottom number, please have this repeated by your primary care provider within one month. --------------  Thank you for allowing [place] to participate in your care today.

## 2018-01-30 NOTE — ED Notes (Signed)
Warm packs given for comfort.

## 2018-02-03 ENCOUNTER — Telehealth: Payer: Self-pay

## 2018-02-03 ENCOUNTER — Ambulatory Visit: Payer: Medicaid Other | Admitting: Family Medicine

## 2018-02-03 NOTE — Telephone Encounter (Signed)
-----   Message from Doreene Eland, MD sent at 01/30/2018  7:19 AM EST ----- please help patient schedule ED f/u appointment with me soon. Thanks.

## 2018-02-03 NOTE — Telephone Encounter (Signed)
LVM asking pt to call the office to be seen for ED follow up visit with pcp. Please assist in scheduling with [place].

## 2018-02-05 ENCOUNTER — Ambulatory Visit: Payer: Medicaid Other | Admitting: Family Medicine

## 2018-02-05 ENCOUNTER — Encounter: Payer: Self-pay | Admitting: Family Medicine

## 2018-07-31 ENCOUNTER — Encounter: Payer: Self-pay | Admitting: Family Medicine

## 2018-07-31 DIAGNOSIS — Z87891 Personal history of nicotine dependence: Secondary | ICD-10-CM | POA: Insufficient documentation

## 2019-04-22 ENCOUNTER — Ambulatory Visit: Payer: Medicaid Other | Attending: Internal Medicine

## 2019-04-22 DIAGNOSIS — Z23 Encounter for immunization: Secondary | ICD-10-CM

## 2019-04-22 NOTE — Progress Notes (Signed)
   Covid-19 Vaccination Clinic  Name:  Debra Strong    MRN: [id number] DOB: [birth year]  04/22/2019  [name] was observed post Covid-19 immunization for 15 minutes without incident. She was provided with Vaccine Information Sheet and instruction to access the V-Safe system.   [name] was instructed to call 911 with any severe reactions post vaccine: Marland Kitchen Difficulty breathing  . Swelling of face and throat  . A fast heartbeat  . A bad rash all over body  . Dizziness and weakness   Immunizations Administered    Name Date Dose VIS Date Route   Pfizer COVID-19 Vaccine 04/22/2019  8:32 AM 0.3 mL 12/18/2018 Intramuscular   Manufacturer: [employer], [employer]   Lot: [id number]   NDC: [address]-9

## 2019-05-17 ENCOUNTER — Ambulatory Visit: Payer: Medicaid Other

## 2019-05-17 ENCOUNTER — Ambulatory Visit: Payer: Medicaid Other | Attending: Internal Medicine

## 2019-05-17 DIAGNOSIS — Z23 Encounter for immunization: Secondary | ICD-10-CM

## 2019-05-17 NOTE — Progress Notes (Signed)
   Covid-19 Vaccination Clinic  Name:  Debra Strong    MRN: [id number] DOB: [birth year]  05/17/2019  Ms. Debra Strong was observed post Covid-19 immunization for 30 minutes based on pre-vaccination screening without incident. She was provided with Vaccine Information Sheet and instruction to access the V-Safe system.   Ms. Debra Strong was instructed to call 911 with any severe reactions post vaccine: Marland Kitchen Difficulty breathing  . Swelling of face and throat  . A fast heartbeat  . A bad rash all over body  . Dizziness and weakness   Immunizations Administered    Name Date Dose VIS Date Route   Pfizer COVID-19 Vaccine 05/17/2019  1:15 PM 0.3 mL 03/03/2018 Intramuscular   Manufacturer: [employer], [employer]   Lot: [id number]   NDC: [address]-4

## 2019-06-21 ENCOUNTER — Encounter: Payer: Self-pay | Admitting: Family Medicine

## 2019-09-10 ENCOUNTER — Ambulatory Visit: Payer: Medicaid Other | Admitting: Family Medicine

## 2021-04-04 ENCOUNTER — Ambulatory Visit (HOSPITAL_COMMUNITY)
Admission: EM | Admit: 2021-04-04 | Discharge: 2021-04-04 | Disposition: A | Discharge status: Home / Self Care | Payer: Medicaid Other | Attending: Family Medicine | Admitting: Family Medicine

## 2021-04-04 ENCOUNTER — Other Ambulatory Visit: Payer: Self-pay

## 2021-04-04 ENCOUNTER — Encounter (HOSPITAL_COMMUNITY): Payer: Self-pay | Admitting: Emergency Medicine

## 2021-04-04 DIAGNOSIS — R21 Rash and other nonspecific skin eruption: Secondary | ICD-10-CM | POA: Diagnosis not present

## 2021-04-04 DIAGNOSIS — L299 Pruritus, unspecified: Secondary | ICD-10-CM | POA: Diagnosis not present

## 2021-04-04 DIAGNOSIS — R5383 Other fatigue: Secondary | ICD-10-CM | POA: Insufficient documentation

## 2021-04-04 DIAGNOSIS — I10 Essential (primary) hypertension: Secondary | ICD-10-CM | POA: Insufficient documentation

## 2021-04-04 LAB — CBC
HCT: 41.8 % (ref 36.0–46.0)
Hemoglobin: 14.6 g/dL (ref 12.0–15.0)
MCH: 30.4 pg (ref 26.0–34.0)
MCHC: 34.9 g/dL (ref 30.0–36.0)
MCV: 86.9 fL (ref 80.0–100.0)
Platelets: 364 10*3/uL (ref 150–400)
RBC: 4.81 MIL/uL (ref 3.87–5.11)
RDW: 13.2 % (ref 11.5–15.5)
WBC: 6.7 10*3/uL (ref 4.0–10.5)
nRBC: 0 % (ref 0.0–0.2)

## 2021-04-04 LAB — TSH: TSH: 0.745 u[IU]/mL (ref 0.350–4.500)

## 2021-04-04 LAB — POCT URINALYSIS DIPSTICK, ED / UC
Bilirubin Urine: NEGATIVE
Glucose, UA: NEGATIVE mg/dL
Ketones, ur: NEGATIVE mg/dL
Leukocytes,Ua: NEGATIVE
Nitrite: NEGATIVE
Protein, ur: NEGATIVE mg/dL
Specific Gravity, Urine: 1.02 (ref 1.005–1.030)
Urobilinogen, UA: 0.2 mg/dL (ref 0.0–1.0)
pH: 5.5 (ref 5.0–8.0)

## 2021-04-04 LAB — COMPREHENSIVE METABOLIC PANEL
ALT: 27 U/L (ref 0–44)
AST: 24 U/L (ref 15–41)
Albumin: 4.3 g/dL (ref 3.5–5.0)
Alkaline Phosphatase: 71 U/L (ref 38–126)
Anion gap: 8 (ref 5–15)
BUN: 10 mg/dL (ref 6–20)
CO2: 27 mmol/L (ref 22–32)
Calcium: 9.7 mg/dL (ref 8.9–10.3)
Chloride: 106 mmol/L (ref 98–111)
Creatinine, Ser: 0.98 mg/dL (ref 0.44–1.00)
GFR, Estimated: >60 mL/min (ref >60)
Glucose, Bld: 87 mg/dL (ref 70–99)
Potassium: 4 mmol/L (ref 3.5–5.1)
Sodium: 141 mmol/L (ref 135–145)
Total Bilirubin: 0.4 mg/dL (ref 0.3–1.2)
Total Protein: 7.7 g/dL (ref 6.5–8.1)

## 2021-04-04 LAB — POC URINE PREG, ED: Preg Test, Ur: NEGATIVE

## 2021-04-04 LAB — CBG MONITORING, ED: Glucose-Capillary: 99 mg/dL (ref 70–99)

## 2021-04-04 NOTE — Discharge Instructions (Signed)
Your blood pressure was noted to be elevated during your visit today. If you are currently taking medication for high blood pressure, please ensure you are taking this as directed. If you do not have a history of high blood pressure and your blood pressure remains persistently elevated, you may need to begin taking a medication at some point. You may return here within the next few days to recheck if unable to see your primary care provider or if you do not have a one. ? ?BP (!) 160/104 (BP Location: Left Arm)   Pulse 85   Temp 98.2 ?F (36.8 ?C) (Oral)   Resp 18   LMP 04/03/2021   SpO2 97%  ? ?BP Readings from Last 3 Encounters:  ?04/04/21 (!) 160/104  ?01/30/18 (!) 152/100  ?09/12/17 112/68  ? ?You have had labs (blood work) drawn today. We will call you with any significant abnormalities or if there is need to begin or change treatment or pursue further follow up. ? ?You may also review your test results online through MyChart. If you do not have a MyChart account, instructions to sign up should be on your discharge paperwork. ? ? ? ?

## 2021-04-04 NOTE — ED Triage Notes (Signed)
One month ago patient started noticing "bug bites" .  Patient noticed one area to face, then had a place on neck.  This was followed by fatigue.  Patient has had a few body aches ?

## 2021-04-04 NOTE — ED Notes (Signed)
Patient is in bathroom again to attempt urine specimen ?

## 2021-04-05 NOTE — ED Provider Notes (Signed)
?[facility] ? ? ?[id number] ?04/04/21 Arrival Time: 1308 ? ?ASSESSMENT & PLAN: ? ?1. Other fatigue   ?2. Rash and nonspecific skin eruption   ?3. Itching   ?4. Elevated blood pressure reading in office with diagnosis of hypertension   ? ?Unclear etiology of sporadic head and neck bumps/rash/itching. ?Blood work pending. May try OTC allergy med if she desires. ? ?No symptoms of HTN urgency. Is taking medications. ?Plans f/u with PCP. ? ?Will follow up with PCP or here if worsening or failing to improve as anticipated. ?Reviewed expectations re: course of current medical issues. Questions answered. ?Outlined signs and symptoms indicating need for more acute intervention. ?Patient verbalized understanding. ?After Visit Summary given. ? ? ?SUBJECTIVE: ? ?Debra Strong is a 36 y.o. female who presents with a skin complaint. Reports sporadic "bumps" of left head and neck over past sev weeks. Questions "bug bites I think". Mild itching. No pain. No new exposures. No tx PTA. Mild body aches over few weeks. Is fatigued. Otherwise well. ?  ?Increased blood pressure noted today. Reports that she is treated for HTN. ?She reports no chest pain on exertion, no dyspnea on exertion, no swelling of ankles, no orthostatic dizziness or lightheadedness, no orthopnea or paroxysmal nocturnal dyspnea, and no palpitations. ? ? ?OBJECTIVE: ?Vitals:  ? 04/04/21 1443 04/04/21 1446  ?BP: (!) 170/122 (!) 160/104  ?Pulse: 85   ?Resp: 18   ?Temp: 98.2 ?F (36.8 ?C)   ?TempSrc: Oral   ?SpO2: 97%   ?  ?General appearance: alert; no distress ?HEENT: [place]; AT ?Neck: supple with FROM; no swelling ?Lungs: clear to auscultation bilaterally ?Heart: regular ?Extremities: no edema; moves all extremities normally ?Skin: warm and dry; signs of infection: no; with a couple of small papules; one of left neck the other of left cheek; otherwise normal skin ?Psychological: alert and cooperative; normal mood and affect ? ?Allergies  ?Allergen  Reactions  ? Amlodipine Palpitations  ?  Pt states she was taking anxiety medication as well and does not know which was causing the heart palpations.  ? Lisinopril Nausea Only and Other (See Comments)  ?  Dizziness  ? Shellfish Allergy Anaphylaxis  ? ? ?Past Medical History:  ?Diagnosis Date  ? Asthma   ? Hypertension   ? Pregnancy induced hypertension   ? ?Social History  ? ?Socioeconomic History  ? Marital status: Single  ?  Spouse name: Not on file  ? Number of children: Not on file  ? Years of education: Not on file  ? Highest education level: Not on file  ?Occupational History  ? Not on file  ?Tobacco Use  ? Smoking status: Former  ?  Types: Cigarettes  ? Smokeless tobacco: Never  ?Vaping Use  ? Vaping Use: Never used  ?Substance and Sexual Activity  ? Alcohol use: No  ?  Alcohol/week: 0.0 standard drinks  ? Drug use: Yes  ?  Types: Marijuana  ? Sexual activity: Yes  ?  Birth control/protection: None  ?Other Topics Concern  ? Not on file  ?Social History Narrative  ? Not on file  ? ?Social Determinants of Health  ? ?Financial Resource Strain: Not on file  ?Food Insecurity: Not on file  ?Transportation Needs: Not on file  ?Physical Activity: Not on file  ?Stress: Not on file  ?Social Connections: Not on file  ?Intimate Partner Violence: Not on file  ? ?Family History  ?Problem Relation Age of Onset  ? Hypertension Mother   ? Diabetes  Father   ? Hypertension Father   ? ?Past Surgical History:  ?Procedure Laterality Date  ? MOUTH SURGERY    ? NO PAST SURGERIES    ? ? ?  ?Mardella Layman, MD ?04/05/21 1201 ? ?

## 2021-04-11 ENCOUNTER — Ambulatory Visit (INDEPENDENT_AMBULATORY_CARE_PROVIDER_SITE_OTHER): Payer: Medicaid Other | Admitting: Family Medicine

## 2021-04-11 VITALS — BP 154/107 | HR 98 | Ht 62.0 in | Wt 200.4 lb

## 2021-04-11 DIAGNOSIS — F4323 Adjustment disorder with mixed anxiety and depressed mood: Secondary | ICD-10-CM | POA: Diagnosis not present

## 2021-04-11 DIAGNOSIS — I10 Essential (primary) hypertension: Secondary | ICD-10-CM

## 2021-04-11 DIAGNOSIS — F419 Anxiety disorder, unspecified: Secondary | ICD-10-CM | POA: Diagnosis not present

## 2021-04-11 MED ORDER — CHLORTHALIDONE 25 MG PO TABS: 25.0000 mg | ORAL_TABLET | Freq: Every day | ORAL | 2 refills | Status: DC

## 2021-04-11 MED ORDER — SERTRALINE HCL 50 MG PO TABS: 50.0000 mg | ORAL_TABLET | Freq: Every day | ORAL | 1 refills | Status: DC

## 2021-04-11 NOTE — Assessment & Plan Note (Signed)
BP elevated in office today as well as at urgent care last week.  Asymptomatic.  Metabolic panel checked in urgent care within normal limits. ?- start chlorthalidone ?- f/u 2 weeks for BP check, BMP ?

## 2021-04-11 NOTE — Patient Instructions (Addendum)
It was nice seeing you today! ? ?Start chlorthalidone 25 mg every day for blood pressure. ? ?Obtain a blood pressure monitor and write down your blood pressure readings. ? ?Start sertraline 50 mg every day for anxiety. ? ?Stay well, ?Littie Deeds, MD ?[facility] ?[phone] ? ?-- ? ?Make sure to check out at the front desk before you leave today. ? ?Please arrive at least 15 minutes prior to your scheduled appointments. ? ?If you had blood work today, I will send you a MyChart message or a letter if results are normal. Otherwise, I will give you a call. ? ?If you had a referral placed, they will call you to set up an appointment. Please give [place] a call if you don't hear back in the next 2 weeks. ? ?If you need additional refills before your next appointment, please call your pharmacy first.  ? ? ?Therapy and Counseling Resources ?Most providers on this list will take Medicaid. Patients with commercial insurance or Medicare should contact their insurance company to get a list of in network providers. ? ?[facility] (spanish speaking therapist available)(habla espanol)  ?[address], [place], [place] [address], [place] ?[email or web address] ?[phone] ? ?[facility] ?[address] [facility] [address] [place], [place] [address] ?[phone] ? ?[facility] ? [address] [place], [place] [address]      [phone] ? ?[facility] ?[address]  [place], [place] [address] ?[phone] ? ?[facility] ?[address]  [place], [place] [address]       [phone]    ? ?[facility] (virtual only) ?[phone] ? ?[facility] ?[address] [place], [place] [phone] ? ?[facility]:  [address] [place] [phone] ? ?[facility]:  ?Coralie Carpen [phone] ? ?[facility] (under & uninsured) ?[address]   [place] [phone]    [email or web address]   ? ?[facility] ?[address]  [place]    [phone] ? ?[facility] ?[facility] -[address]     Phone:  [phone]     [facility]-  [address]  [phone]  ? ?[facility] ?[address] [place], [place] [phone] ? ?[facility]: [address] [place], [place], [place]  [phone]  ? ?[facility] (Spanish therapist) [email or web address].[email or web address]/  ?[address]   [place] [address]    [phone]   ? ?[facility]   ?[employer],  [place], [place]  [phone]  ? ?[facility]  ?[address] [place]  [phone] ? ?[facility]  ?[address] [place], [place]        [phone] ? ?Open Access/Walk In Clinic under & uninsured ? ?[facility]  ?[address] [place], [place] ?Front Line [phone] ?Crisis [phone] ? ?[facility] [address],  ?(Spanish)   [address], [place]: [phone]) 8:30 - 12; 1 - 2:30 ? ?[facility] [employer],  ?[address], [place]    [phone]):8:30 - 12; 2 - 3PM ? ?[facility] [employer],  ?[address]  [place]; [phone]):   Mon - Fri 8 AM - 5 PM ? ?[facility] ?[address] [place]  MWF 12:30 to 3:00 or call to schedule an appointment  [phone] ? ?Specific Provider options ?[facility] Today  https://www.psychologytoday.com/us ?click on find a therapist  ?enter your zip code ?left side and select or tailor a therapist for your specific need.  ? ?[facility] Provider Directory ?[email or web address]/  (Medicaid)   Follow all drop down to find a provider ? ?Social Support program ?[facility] ?336) I7437963 or [employer]pl ?[address], [place], [place]  Recovery support and educational  ? ?24- Hour Availability:  ? ?[facility]  ?[address] [place], [place] ?Front Line [phone] ?Crisis [phone] ? ?[facility] [phone] ? ?[employer] [facility]  [phone]  ? ?[facility] [employer]  [phone] (after hours) ? ?[facility]/Mobile Crisis   [phone] ? ?[place] National Suicide Hotline  337-097-4569 Len Childs) ? ?Call 911 or go to emergency room ? ?[facility]  [phone]);  [place] and [place]  ? ?[facility]  ?([phone]); [place], [place], [place], [place], [place], [place], [place]  ?

## 2021-04-11 NOTE — Progress Notes (Signed)
? ? ?  SUBJECTIVE:  ? ?CHIEF COMPLAINT / HPI:  ?Chief Complaint  ?Patient presents with  ? Hypertension  ?  ?Patient presents for concern for elevated blood pressure.  She was seen at urgent care last week and was noted to have elevated blood pressure reading.  She was previously on antihypertensives but has not been on anything recently.  She does not have a BP cuff at home.  She has had some headaches for the past month.  She has also had some vision changes for the past 3 months ever since her glasses broke.  She has not been able to see the eye doctor yet for a new prescription but she is established with one.  Denies chest pain, shortness of breath. ? ?Patient reports she has had multiple recent stressors.  She started a new job at [employer] last month which has been stressful.  She also may be moving in the near future but unsure where.  She reports predominant symptoms of anxiety and would like to start a medication today. ? ?PERTINENT  PMH / PSH: Anxiety, depressed mood, HTN ? ?Patient Care Team: ?Kinnie Feil, MD as PCP - General (Family Medicine)  ? ?OBJECTIVE:  ? ?BP (!) 154/107   Pulse 98   Ht 5\' 2"  (1.575 m)   Wt 200 lb 6.4 oz (90.9 kg)   LMP 04/03/2021   SpO2 100%   BMI 36.65 kg/m?   ?Physical Exam ?Constitutional:   ?   General: She is not in acute distress. ?HENT:  ?   Head: Normocephalic and atraumatic.  ?Cardiovascular:  ?   Rate and Rhythm: Normal rate and regular rhythm.  ?Pulmonary:  ?   Effort: Pulmonary effort is normal. No respiratory distress.  ?   Breath sounds: Normal breath sounds.  ?Musculoskeletal:  ?   Cervical back: Neck supple.  ?Neurological:  ?   Mental Status: She is alert.  ?Psychiatric:  ?   Comments: Appears somewhat anxious, affect appropriate  ?  ? ? ?  04/11/2021  ?  3:01 PM  ?Depression screen PHQ 2/9  ?Decreased Interest 2  ?Down, Depressed, Hopeless 1  ?PHQ - 2 Score 3  ?Altered sleeping 2  ?Tired, decreased energy 2  ?Change in appetite 2  ?Feeling bad or  failure about yourself  1  ?Trouble concentrating 2  ?Moving slowly or fidgety/restless 1  ?Suicidal thoughts 0  ?PHQ-9 Score 13  ?Difficult doing work/chores Somewhat difficult  ?  ? ?{Show previous vital signs (optional):23777} ? ? ? ?ASSESSMENT/PLAN:  ? ?Essential hypertension, benign ?BP elevated in office today as well as at urgent care last week.  Asymptomatic.  Metabolic panel checked in urgent care within normal limits. ?- start chlorthalidone ?- f/u 2 weeks for BP check, BMP ? ?Adjustment disorder with mixed anxiety and depressed mood ?Anxiety related to multiple recent stressors.  No SI/HI. ?- start sertraline 50 mg daily ?- therapy resources given ?- f/u 1 month ?  ? ?Return in about 2 weeks (around 04/25/2021) for f/u HTN, anxiety.  ? ?Zola Button, MD ?[facility]  ?

## 2021-04-11 NOTE — Assessment & Plan Note (Signed)
Anxiety related to multiple recent stressors.  No SI/HI. ?- start sertraline 50 mg daily ?- therapy resources given ?- f/u 1 month ?

## 2021-04-21 NOTE — Progress Notes (Deleted)
    SUBJECTIVE:   CHIEF COMPLAINT / HPI:   Blood pressure follow-up: Patient seen on 04/11/2021 with blood pressure elevated.  She was started on chlorthalidone and recommended follow-up for blood pressure check today as well as BMP.  She states***.  Adjustment disorder with mixed anxiety and depressed mood: At the last appointment on 04/11/2021 she was started on sertraline 50 mg daily with the thought that anxiety was her primary concern due to multiple recent stressors.  Today she denies SI/HI***.  She states***.  PERTINENT  PMH / PSH: ***  OBJECTIVE:   LMP 04/03/2021  ***  General: NAD, pleasant, able to participate in exam Cardiac: RRR, no murmurs. Respiratory: CTAB, normal effort, No wheezes, rales or rhonchi Abdomen: Bowel sounds present, nontender, nondistended, no hepatosplenomegaly. Extremities: no edema or cyanosis. Skin: warm and dry, no rashes noted Neuro: alert, no obvious focal deficits Psych: Normal affect and mood  ASSESSMENT/PLAN:   No problem-specific Assessment & Plan notes found for this encounter.     Lurline Del, [place]    {    This will disappear when note is signed, click to select method of visit    :1}

## 2021-04-24 ENCOUNTER — Ambulatory Visit: Payer: Medicaid Other | Admitting: Family Medicine

## 2021-04-25 ENCOUNTER — Ambulatory Visit: Payer: Medicaid Other | Admitting: Family Medicine

## 2021-05-21 ENCOUNTER — Other Ambulatory Visit: Payer: Self-pay

## 2021-05-21 ENCOUNTER — Emergency Department (HOSPITAL_COMMUNITY)
Admission: EM | Admit: 2021-05-21 | Discharge: 2021-05-21 | Disposition: A | Discharge status: Home / Self Care | Payer: Medicaid Other | Attending: Emergency Medicine | Admitting: Emergency Medicine

## 2021-05-21 ENCOUNTER — Encounter (HOSPITAL_COMMUNITY): Payer: Self-pay | Admitting: Emergency Medicine

## 2021-05-21 DIAGNOSIS — X58XXXA Exposure to other specified factors, initial encounter: Secondary | ICD-10-CM | POA: Insufficient documentation

## 2021-05-21 DIAGNOSIS — S161XXA Strain of muscle, fascia and tendon at neck level, initial encounter: Secondary | ICD-10-CM | POA: Diagnosis not present

## 2021-05-21 DIAGNOSIS — S199XXA Unspecified injury of neck, initial encounter: Secondary | ICD-10-CM | POA: Diagnosis present

## 2021-05-21 MED ORDER — KETOROLAC TROMETHAMINE 15 MG/ML IJ SOLN
15.0000 mg | Freq: Once | INTRAMUSCULAR | Status: AC
  Administered 2021-05-21: 15 mg via INTRAMUSCULAR
  Filled 2021-05-21: qty 1

## 2021-05-21 MED ORDER — CYCLOBENZAPRINE HCL 10 MG PO TABS: 10.0000 mg | ORAL_TABLET | Freq: Two times a day (BID) | ORAL | 0 refills | Status: DC | PRN

## 2021-05-21 NOTE — ED Triage Notes (Signed)
Pt reported to ED with c/o pain to posterior neck that radiates down back since yesterday. Pt denies fall or injury but states she has been doing a lot of pulling, bending and lifting. States she has a hx of pinched nerve in upper back.  ?

## 2021-05-21 NOTE — Discharge Instructions (Addendum)
Return for any problem. ? ?Use ibuprofen -600 mg taken every 8 hours -as needed for anti-inflammatory effect. ? ?Use muscle relaxant as prescribed if needed. ?

## 2021-05-21 NOTE — ED Provider Notes (Signed)
?[facility] EMERGENCY DEPARTMENT ?Provider Note ? ? ?CSN: [id number] ?Arrival date & time: 05/21/21  1816 ? ?  ? ?History ? ?Chief Complaint  ?Patient presents with  ? Neck Pain  ? ? ?Debra [place] is a 36 y.o. female. ? ?36 year old female with prior medical history as detailed below presents for evaluation.  Patient reports that she did significant amounts of heavy lifting over the weekend.  Patient reports that over the last 24 hours she began to have soreness in tightness in her shoulders and neck. ? ?She had significant discomfort this morning and did not go to work.  She is requesting a work note. ? ?She did not take anything for her pain today. ? ?Yesterday she trialed ibuprofen and Tylenol with some improvement. ? ?The history is provided by the patient and medical records.  ?Neck Pain ?Pain location:  Generalized neck ?Quality:  Aching ?Pain radiates to:  Does not radiate ?Pain severity:  Mild ?Onset quality:  Gradual ?Duration:  3 days ?Progression:  Waxing and waning ?Chronicity:  New ?Context: lifting a heavy object   ? ?  ? ?Home Medications ?Prior to Admission medications   ?Medication Sig Start Date End Date Taking? Authorizing Provider  ?cyclobenzaprine (FLEXERIL) 10 MG tablet Take 1 tablet (10 mg total) by mouth 2 (two) times daily as needed for muscle spasms. 05/21/21  Yes Wynetta Fines, MD  ?chlorthalidone (HYGROTON) 25 MG tablet Take 1 tablet (25 mg total) by mouth daily. 04/11/21   Littie Deeds, MD  ?sertraline (ZOLOFT) 50 MG tablet Take 1 tablet (50 mg total) by mouth daily. 04/11/21   Littie Deeds, MD  ?   ? ?Allergies    ?Amlodipine, Lisinopril, and Shellfish allergy   ? ?Review of Systems   ?Review of Systems  ?Musculoskeletal:  Positive for neck pain.  ?All other systems reviewed and are negative. ? ?Physical Exam ?Updated Vital Signs ?BP (!) 156/107 (BP Location: Left Arm)   Pulse 95   Temp 99.8 ?F (37.7 ?C) (Oral)   Resp 17   SpO2 100%  ?Physical Exam ?Vitals and  nursing note reviewed.  ?Constitutional:   ?   General: She is not in acute distress. ?   Appearance: Normal appearance. She is well-developed.  ?HENT:  ?   Head: Normocephalic and atraumatic.  ?Eyes:  ?   Conjunctiva/sclera: Conjunctivae normal.  ?   Pupils: Pupils are equal, round, and reactive to light.  ?Cardiovascular:  ?   Rate and Rhythm: Normal rate and regular rhythm.  ?   Heart sounds: Normal heart sounds.  ?Pulmonary:  ?   Effort: Pulmonary effort is normal. No respiratory distress.  ?   Breath sounds: Normal breath sounds.  ?Abdominal:  ?   General: There is no distension.  ?   Palpations: Abdomen is soft.  ?   Tenderness: There is no abdominal tenderness.  ?Musculoskeletal:     ?   General: No deformity. Normal range of motion.  ?   Cervical back: Normal range of motion and neck supple.  ?   Comments: Mild diffuse muscular tenderness with palpation over the upper back.  No discrete bony tenderness.  No step-off.  No impairment in patient's range of motion of the upper back or neck.  ?Skin: ?   General: Skin is warm and dry.  ?Neurological:  ?   General: No focal deficit present.  ?   Mental Status: She is alert and oriented to person, place, and time.  ? ? ?  ED Results / Procedures / Treatments   ?Labs ?(all labs ordered are listed, but only abnormal results are displayed) ?Labs Reviewed - No data to display ? ?EKG ?None ? ?Radiology ?No results found. ? ?Procedures ?Procedures  ? ? ?Medications Ordered in ED ?Medications  ?ketorolac (TORADOL) 15 MG/ML injection 15 mg (15 mg Intramuscular Given 05/21/21 2119)  ? ? ?ED Course/ Medical Decision Making/ A&P ?  ?                        ?Medical Decision Making ?Risk ?Prescription drug management. ? ? ? ?Medical Screen Complete ? ?This patient presented to the ED with complaint of neck and upper back strain. ? ?This complaint involves an extensive number of treatment options. The initial differential diagnosis includes, but is not limited to, muscular neck and  upper back strain ? ?This presentation is: Acute, Self-Limited, and Previously Undiagnosed ? ?Patient is reporting neck and upper back strain after significant heavy lifting ? ?Patient with minimal use of medications at home ? ?Patient is advised to apply ice and/or heat.  She is advised to use ibuprofen for anti-inflammatory effect.  Patient may benefit from some use of an muscle relaxant. ? ?Importance of close follow-up stressed.  Strict return precautions given and understood. ? ? ?Additional history obtained: ? ?External records from outside sources obtained and reviewed including prior ED visits and prior Inpatient records.  ?Medicines ordered: ? ?I ordered medication including Toradol for pain ?Reevaluation of the patient after these medicines showed that the patient: improved ? ? ?Problem List / ED Course: ? ?Muscular strain of neck and upper back ? ? ?Reevaluation: ? ?After the interventions noted above, I reevaluated the patient and found that they have: improved ?Disposition: ? ?After consideration of the diagnostic results and the patients response to treatment, I feel that the patent would benefit from close outpatient follow-up.  ? ? ? ? ? ? ? ? ?Final Clinical Impression(s) / ED Diagnoses ?Final diagnoses:  ?Strain of neck muscle, initial encounter  ? ? ?Rx / DC Orders ?ED Discharge Orders   ? ?      Ordered  ?  cyclobenzaprine (FLEXERIL) 10 MG tablet  2 times daily PRN       ? 05/21/21 2122  ? ?  ?  ? ?  ? ? ?  ?Wynetta Fines, MD ?05/21/21 2126 ? ?

## 2021-05-21 NOTE — ED Provider Triage Note (Signed)
Emergency Medicine Provider Triage Evaluation Note ? ?Debra Strong , a 36 y.o. female  was evaluated in triage.  Pt complains of left-sided and middle back pain.  Symptoms began last night with minimal improvement with Tylenol.  States that she was doing a lot of heavy lifting and pulling over the weekend.  Denies any prior neck surgeries or direct injury. ? ?Review of Systems  ?Positive: Neck pain, back pain ?Negative: Weakness ? ?Physical Exam  ?BP (!) 143/121   Pulse (!) 110   Temp 99.8 ?F (37.7 ?C) (Oral)   Resp 18   SpO2 98%  ?Gen:   Awake, no distress   ?Resp:  Normal effort  ?MSK:   Moves extremities without difficulty  ?Other:  Tenderness palpation of the upper back ? ?Medical Decision Making  ?Medically screening exam initiated at 7:20 PM.  Appropriate orders placed.  Debra [place] was informed that the remainder of the evaluation will be completed by another provider, this initial triage assessment does not replace that evaluation, and the importance of remaining in the ED until their evaluation is complete. ? ? ?  ?Dietrich Pates, PA-C ?05/21/21 1921 ? ?

## 2021-05-21 NOTE — ED Notes (Addendum)
Pt states she has a hx of high blood pressure and she is waiting to get in with PCP for treatment of the same.  ?

## 2021-06-05 ENCOUNTER — Encounter: Payer: Self-pay | Admitting: Family Medicine

## 2021-06-05 ENCOUNTER — Ambulatory Visit (INDEPENDENT_AMBULATORY_CARE_PROVIDER_SITE_OTHER): Payer: Medicaid Other | Admitting: Family Medicine

## 2021-06-05 VITALS — BP 137/94 | HR 85 | Ht 62.0 in | Wt 201.8 lb

## 2021-06-05 DIAGNOSIS — R6884 Jaw pain: Secondary | ICD-10-CM

## 2021-06-05 DIAGNOSIS — R5383 Other fatigue: Secondary | ICD-10-CM | POA: Diagnosis not present

## 2021-06-05 DIAGNOSIS — I10 Essential (primary) hypertension: Secondary | ICD-10-CM | POA: Diagnosis not present

## 2021-06-05 DIAGNOSIS — E079 Disorder of thyroid, unspecified: Secondary | ICD-10-CM | POA: Diagnosis not present

## 2021-06-05 DIAGNOSIS — F4323 Adjustment disorder with mixed anxiety and depressed mood: Secondary | ICD-10-CM

## 2021-06-05 HISTORY — DX: Jaw pain: R68.84

## 2021-06-05 NOTE — Assessment & Plan Note (Signed)
TSH and thyroid US obtained. I will contact her soon with her results.

## 2021-06-05 NOTE — Assessment & Plan Note (Signed)
S/P tooth extraction. 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 Dental f/u recommended. She agreed with the plan.

## 2021-06-05 NOTE — Progress Notes (Signed)
   SUBJECTIVE:   CHIEF COMPLAINT / HPI:   Thyroid concern: She is concerned about neck fullness, no pain or dysphagia. She would like to get this checked.   Jaw pain:  She had a recent took extraction about 2-3 weeks ago. She facial swelling after the procedure which has improved. However, she sometimes feels like her jaw pops on her with difficulty opening her mouth.   HTN: She is compliant with her Chlorthalidone.  Fatigue/MDD/GAD:  She c/o persistent fatigue after work. 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 She gets overwhelmed when she gets home from work, being the only one who self-manages her home. She self d/c her Zoloft because it worsens her fatigue.  PERTINENT  PMH / PSH: PMHx reviewed  OBJECTIVE:   BP (!) 137/94   Pulse 85   Ht 5\' 2"  (1.575 m)   Wt 201 lb 12.8 oz (91.5 kg)   LMP 06/01/2021   SpO2 100%   BMI 36.91 kg/m   Physical Exam Vitals and nursing note reviewed.  HENT:     Mouth/Throat:     Dentition: No dental tenderness or gingival swelling.     Tongue: No lesions.     Comments: No jaw swelling or tenderness. She could open her mouth widely Eyes:     Extraocular Movements: Extraocular movements intact.  Neck:     Thyroid: Thyromegaly present. No thyroid tenderness.     Comments: Mild to moderate soft, non-tender thyromegaly Cardiovascular:     Rate and Rhythm: Normal rate and regular rhythm.     Heart sounds: Normal heart sounds. No murmur heard. Pulmonary:     Effort: Pulmonary effort is normal. No respiratory distress.     Breath sounds: Normal breath sounds. No wheezing.  Abdominal:     Palpations: There is no mass.  Musculoskeletal:     Cervical back: Neck supple.     ASSESSMENT/PLAN:   Thyroid mass TSH and thyroid 06/03/2021 obtained. I will contact her soon with her results.   Jaw pain S/P tooth extraction. 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 Dental f/u  recommended. She agreed with the plan.   Essential hypertension, benign She is compliant with her Chlorthalidone 25 mg QD. Diastolic BP slightly elevated. Monitor for now on her current regimen. F/U in 4 weeks for reassessment.  Fatigue Recent labs including CBC reviewed and was normal. May be related to her depression and anxiety as well as stress. Stress relieving counseling provided  Her father is someone she feels she can talk to to help relieve her stress and anxiety. LMP 06/01/21 - currently on her period. Not sexually active. Monitor for now.   Adjustment disorder with mixed anxiety and depressed mood She is non-SI/HI She self-discontinued her Zoloft. She does not wish to get back on medication when offered to switch. Counseling recommended. I gave her list of therapist she can contact for an appointment. F/U in 4 weeks for reassessment. She agreed with the plan.    PAP offered today - she prefers to f/u.   06/03/21, MD [facility]

## 2021-06-05 NOTE — Assessment & Plan Note (Signed)
She is non-SI/HI She self-discontinued her Zoloft. She does not wish to get back on medication when offered to switch. Counseling recommended. I gave her list of therapist she can contact for an appointment. F/U in 4 weeks for reassessment. She agreed with the plan.

## 2021-06-05 NOTE — Assessment & Plan Note (Addendum)
Recent labs including CBC reviewed and was normal. May be related to her depression and anxiety as well as stress. Stress relieving counseling provided  Her father is someone she feels she can talk to to help relieve her stress and anxiety. LMP 06/01/21 - currently on her period. Not sexually active. Monitor for now.

## 2021-06-05 NOTE — Patient Instructions (Signed)
Psychiatry Resource List (Adults and Children) Most of these providers will take Medicaid. please consult your insurance for a complete and updated list of available providers. When calling to make an appointment have your insurance information available to confirm you are covered.   [facility] [address] [place], [place] [address] [phone]  [facility]  [address] [place], [place] Front Line [phone] Crisis [phone]   [facility]:   [place]: [address]     [phone]   [place]: [address],        [phone] [place]: [address],    [phone] [place]: [address],                   [phone] Children: [facility] [address]         [phone]  [facility] (virtual only) [phone]    [facility]  (Psychiatry only; Adults /children 12 and over, will take Medicaid)  [address], [place], [place] [address]       [phone]   [facility] (Psychiatry & counseling ; adults & children ; will take Medicaid [address]  [place] [address]  Go on-line to complete referral ( https://www.[email or web address]/en/make-[email or web address]-referral [phone]    (Spanish speaking therapists)  [facility]  Psychiatry & counseling; Adults and children;  Call Registration prior to scheduling an appointment [phone] [address]    [place], [place] [address]    [phone]  [facility] (Psychiatry & counseling; adults & children; Medicare no Medicaid)  [address]   [place], [place]  [address]      [phone]    [facility] (up to age 21)  Psychiatry & counseling ,will take Medicaid, must do counseling to receive psychiatry services  [address] [place]  [place] [address]        [phone]  [facility] (Psychiatry & counseling; adults & children; will take Medicaid) Will need a referral from provider [address],  [place], [place]  [phone]   [facility] --- Walk-In Mon-Friday 8am-3pm ( will take Medicaid, Psychiatry, Adults & children,  [address], [place], [place]   [phone]   [facility], Walk-in M-F 8am-12pm and 1pm -3pm   (Counseling, Psychiatry, will take Medicaid, adults & children)  [address], [place], [place]  [phone]   

## 2021-06-05 NOTE — Assessment & Plan Note (Signed)
She is compliant with her Chlorthalidone 25 mg QD. Diastolic BP slightly elevated. Monitor for now on her current regimen. F/U in 4 weeks for reassessment.

## 2021-06-06 ENCOUNTER — Telehealth: Payer: Self-pay | Admitting: Family Medicine

## 2021-06-06 LAB — TSH: TSH: 1.21 u[IU]/mL (ref 0.450–4.500)

## 2021-06-06 NOTE — Telephone Encounter (Signed)
HIPAA compliant callback message left.  Please, advise her that her TSH is normal. I will contact her once I have her thyroid US result.

## 2021-06-06 NOTE — Telephone Encounter (Signed)
Patient returns call to nurse line.   Patient advised of TSH result.   [place] scheduled for Friday.

## 2021-06-08 ENCOUNTER — Ambulatory Visit (HOSPITAL_COMMUNITY): Admission: RE | Admit: 2021-06-08 | Payer: Medicaid Other | Source: Ambulatory Visit

## 2021-06-12 ENCOUNTER — Encounter: Payer: Self-pay | Admitting: *Deleted

## 2021-06-13 ENCOUNTER — Other Ambulatory Visit: Payer: Self-pay | Admitting: Family Medicine

## 2021-07-11 ENCOUNTER — Emergency Department (HOSPITAL_COMMUNITY)
Admission: EM | Admit: 2021-07-11 | Discharge: 2021-07-12 | Disposition: A | Discharge status: Home / Self Care | Payer: Medicaid Other | Attending: Emergency Medicine | Admitting: Emergency Medicine

## 2021-07-11 ENCOUNTER — Encounter (HOSPITAL_COMMUNITY): Payer: Self-pay | Admitting: Emergency Medicine

## 2021-07-11 DIAGNOSIS — M25551 Pain in right hip: Secondary | ICD-10-CM | POA: Insufficient documentation

## 2021-07-11 DIAGNOSIS — M545 Low back pain, unspecified: Secondary | ICD-10-CM | POA: Insufficient documentation

## 2021-07-11 NOTE — ED Provider Triage Note (Signed)
Emergency Medicine Provider Triage Evaluation Note  Debra Strong , a 36 y.o. female  was evaluated in triage.  Pt complains of right hip and low back pain after a fall.  Patient reports a few days ago she slipped and fell in the tub landing on her right hip and low back.  She reports since then she has tried to apply ice and heat but has continued to have pain.  She has been able to walk but reports it is uncomfortable.  Reports a remote history of a hip injury that she never had evaluated.  Review of Systems  Positive: Hip pain, back pain Negative: Numbness, weakness, loss of bowel or bladder control  Physical Exam  BP (!) 139/104 (BP Location: Right Arm)   Pulse 91   Temp 98.8 F (37.1 C) (Oral)   Resp 18   SpO2 100%  Gen:   Awake, no distress   Resp:  Normal effort  MSK:   Moves extremities without difficulty tenderness over the right hip and low back without palpable deformity or instability, distal pulses 2+ Other:    Medical Decision Making  Medically screening exam initiated at 11:34 PM.  Appropriate orders placed.  Debra Strong was informed that the remainder of the evaluation will be completed by another provider, this initial triage assessment does not replace that evaluation, and the importance of remaining in the ED until their evaluation is complete.  X-rays ordered for fall with hip and back pain.   Debra Strong, [place] 07/11/21 2340

## 2021-07-11 NOTE — ED Triage Notes (Signed)
Pt reports she fell in the tub a few days ago.  Has pain to right hip, lower back and down the leg. Pt has full movement and strength

## 2021-07-12 ENCOUNTER — Emergency Department (HOSPITAL_COMMUNITY): Payer: Medicaid Other

## 2021-07-12 LAB — POC URINE PREG, ED: Preg Test, Ur: NEGATIVE

## 2021-07-12 MED ORDER — NAPROXEN 500 MG PO TABS: 500.0000 mg | ORAL_TABLET | Freq: Two times a day (BID) | ORAL | 0 refills | Status: AC

## 2021-07-12 NOTE — Discharge Instructions (Addendum)
I prescribed a short course of anti-inflammatories to help treat your right hip pain.  Please take 1 tablet twice a day with food for the next 7 days.  In addition, you may also benefit from some IcyHot patches, heat or ice every 20 minutes to your right hip.  Phone number to our orthopedic specialist attached to your chart, please schedule an appointment for further evaluation.

## 2021-07-12 NOTE — ED Notes (Signed)
Patient verbalizes understanding of discharge instructions. Opportunity for questioning and answers were provided. Pt discharged from ED. 

## 2021-07-12 NOTE — ED Provider Notes (Signed)
[facility] EMERGENCY DEPARTMENT Provider Note   CSN: [id number] Arrival date & time: 07/11/21  2207     History  Chief Complaint  Patient presents with   Back Pain    Debra Strong is a 36 y.o. female.  36 y.o female with no PMH presents to the ED with a chief complaint of right hip pain which began 2 days ago.  Patient reports she was trying to exit the tub, when suddenly she did a split-to land on the right side of her head.  She is now complaining of right hip pain, low back pain exacerbated while standing.  Patient is currently employed at [employer], reports every other hour she needs to stretch the right hip as there is worsening pain.  She has tried ice to the area without any improvement in symptoms.  She does have a prior history of right hip pain that is been ongoing for several years but has never been evaluated by specialist.  She denies any urinary retention, bowel incontinence, fever, prior history of IV drug use.  The history is provided by the patient and medical records.  Back Pain Associated symptoms: no abdominal pain, no chest pain, no fever and no headaches   35      Home Medications Prior to Admission medications   Medication Sig Start Date End Date Taking? Authorizing Provider  naproxen (NAPROSYN) 500 MG tablet Take 1 tablet (500 mg total) by mouth 2 (two) times daily for 7 days. 07/12/21 07/19/21 Yes Juanluis Guastella, PA-C  chlorthalidone (HYGROTON) 25 MG tablet Take 1 tablet (25 mg total) by mouth daily. 04/11/21   Littie Deeds, MD  cyclobenzaprine (FLEXERIL) 10 MG tablet Take 1 tablet (10 mg total) by mouth 2 (two) times daily as needed for muscle spasms. Patient not taking: Reported on 06/05/2021 05/21/21   Wynetta Fines, MD  sertraline (ZOLOFT) 50 MG tablet Take 1 tablet (50 mg total) by mouth daily. 04/11/21   Littie Deeds, MD      Allergies    Amlodipine, Lisinopril, and Shellfish allergy    Review of Systems   Review of Systems   Constitutional:  Negative for chills and fever.  Respiratory:  Negative for shortness of breath.   Cardiovascular:  Negative for chest pain and leg swelling.  Gastrointestinal:  Negative for abdominal pain.  Genitourinary:  Negative for difficulty urinating.  Musculoskeletal:  Positive for back pain.  Neurological:  Negative for headaches.  All other systems reviewed and are negative.   Physical Exam Updated Vital Signs BP (!) 134/101 (BP Location: Left Arm)   Pulse 75   Temp 98.8 F (37.1 C) (Oral)   Resp 16   Ht 5\' 2"  (1.575 m)   Wt 88.5 kg   SpO2 99%   BMI 35.67 kg/m  Physical Exam Vitals and nursing note reviewed.  Constitutional:      Appearance: Normal appearance.  HENT:     Head: Normocephalic and atraumatic.     Mouth/Throat:     Mouth: Mucous membranes are moist.  Eyes:     Pupils: Pupils are equal, round, and reactive to light.  Cardiovascular:     Rate and Rhythm: Normal rate.  Pulmonary:     Effort: Pulmonary effort is normal.  Abdominal:     General: Abdomen is flat.  Musculoskeletal:     Cervical back: Normal range of motion and neck supple.     Right hip: Tenderness present. No bony tenderness. Decreased range of motion.  Normal strength.     Left hip: No tenderness. Normal range of motion. Normal strength.     Comments: Full range of motion with hip extension and flexion.  Tenderness to palpation along the right hip joint.  No bruising, hematoma noted.  Skin:    General: Skin is warm and dry.  Neurological:     Mental Status: She is alert and oriented to person, place, and time.     ED Results / Procedures / Treatments   Labs (all labs ordered are listed, but only abnormal results are displayed) Labs Reviewed  POC URINE PREG, ED    EKG None  Radiology DG Hip Unilat W or Wo Pelvis 2-3 Views Right  Result Date: 07/12/2021 CLINICAL DATA:  Fall with back and hip pain. EXAM: DG HIP (WITH OR WITHOUT PELVIS) 2-3V RIGHT COMPARISON:  None  Available. FINDINGS: No fracture of the pelvis or right hip. No dislocation. The femoral head is well seated. Intact pubic rami. Pubic symphysis and sacroiliac joints are congruent. Incidental bone island in the right proximal femur. IMPRESSION: No fracture or dislocation of the pelvis or right hip. Electronically Signed   By: Narda Rutherford M.D.   On: 07/12/2021 01:05   DG Lumbar Spine Complete  Result Date: 07/12/2021 CLINICAL DATA:  Post fall with back and hip pain. EXAM: LUMBAR SPINE - COMPLETE 4+ VIEW COMPARISON:  08/28/2015 FINDINGS: There are 5 non-rib-bearing lumbar vertebra. The alignment is maintained. Vertebral body heights are normal. There is no listhesis. The posterior elements are intact. Disc spaces are preserved. No fracture. Mild L5-S1 facet hypertrophy. Sacroiliac joints are symmetric and normal. IMPRESSION: 1. No fracture or subluxation of the lumbar spine. 2. Mild L5-S1 facet hypertrophy. Electronically Signed   By: Narda Rutherford M.D.   On: 07/12/2021 01:04    Procedures Procedures   Medications Ordered in ED Medications - No data to display  ED Course/ Medical Decision Making/ A&P                           Medical Decision Making Risk Prescription drug management.   Patient presents to the ED with right hip pain status post fall a few days ago.  On evaluation there is full range of motion with hip extension, hip flexion she was ambulatory in the ED. She's afebrile. Low suspicion for infection in the setting of trauma.  She has not tried any medications for improvement and sent this, however has been applying ice to the area.  There is some pain along the lumbar spine.  X-rays obtained in triage did not show any acute fracture of the lumbar spine, right hip joint.  She does report her right hip pain has been ongoing for several years, does do a good amount of standing for her job at [employer].  Discussed x-ray findings with patient, we discussed conservative therapy, I  did discuss with her that while x-rays are able to evaluate fractures, MRIs may be needed in the future after follow-up with orthopedic specialist.  She is agreeable to plan and management at this time.  Her vitals are within normal limits.  Patient stable for discharge.   Portions of this note were generated with Scientist, clinical (histocompatibility and immunogenetics). Dictation errors may occur despite best attempts at proofreading.   Final Clinical Impression(s) / ED Diagnoses Final diagnoses:  Right hip pain    Rx / DC Orders ED Discharge Orders  Ordered    naproxen (NAPROSYN) 500 MG tablet  2 times daily        07/12/21 0744              Claude Manges, PA-C 07/12/21 0755    Gerhard Munch, MD 07/12/21 [phone]

## 2021-08-16 ENCOUNTER — Other Ambulatory Visit: Payer: Self-pay | Admitting: Family Medicine

## 2021-09-13 ENCOUNTER — Other Ambulatory Visit: Payer: Self-pay | Admitting: Family Medicine

## 2021-09-28 ENCOUNTER — Emergency Department (HOSPITAL_COMMUNITY): Payer: Medicaid Other

## 2021-09-28 ENCOUNTER — Other Ambulatory Visit: Payer: Self-pay

## 2021-09-28 ENCOUNTER — Observation Stay (HOSPITAL_COMMUNITY)
Admission: EM | Admit: 2021-09-28 | Discharge: 2021-09-30 | Disposition: A | Discharge status: Home / Self Care | Payer: Medicaid Other | Attending: General Surgery | Admitting: General Surgery

## 2021-09-28 ENCOUNTER — Encounter (HOSPITAL_COMMUNITY): Payer: Self-pay

## 2021-09-28 DIAGNOSIS — Z20822 Contact with and (suspected) exposure to covid-19: Secondary | ICD-10-CM | POA: Insufficient documentation

## 2021-09-28 DIAGNOSIS — J45909 Unspecified asthma, uncomplicated: Secondary | ICD-10-CM | POA: Insufficient documentation

## 2021-09-28 DIAGNOSIS — Z79899 Other long term (current) drug therapy: Secondary | ICD-10-CM | POA: Insufficient documentation

## 2021-09-28 DIAGNOSIS — K819 Cholecystitis, unspecified: Secondary | ICD-10-CM | POA: Diagnosis present

## 2021-09-28 DIAGNOSIS — R1011 Right upper quadrant pain: Secondary | ICD-10-CM | POA: Diagnosis present

## 2021-09-28 DIAGNOSIS — Z91040 Latex allergy status: Secondary | ICD-10-CM | POA: Diagnosis not present

## 2021-09-28 DIAGNOSIS — K8012 Calculus of gallbladder with acute and chronic cholecystitis without obstruction: Principal | ICD-10-CM | POA: Insufficient documentation

## 2021-09-28 DIAGNOSIS — Z87891 Personal history of nicotine dependence: Secondary | ICD-10-CM | POA: Diagnosis not present

## 2021-09-28 DIAGNOSIS — I10 Essential (primary) hypertension: Secondary | ICD-10-CM | POA: Insufficient documentation

## 2021-09-28 DIAGNOSIS — K8000 Calculus of gallbladder with acute cholecystitis without obstruction: Secondary | ICD-10-CM

## 2021-09-28 LAB — CBC WITH DIFFERENTIAL/PLATELET
Abs Immature Granulocytes: 0.02 10*3/uL (ref 0.00–0.07)
Basophils Absolute: 0 10*3/uL (ref 0.0–0.1)
Basophils Relative: 0 %
Eosinophils Absolute: 0.1 10*3/uL (ref 0.0–0.5)
Eosinophils Relative: 2 %
HCT: 41 % (ref 36.0–46.0)
Hemoglobin: 14.3 g/dL (ref 12.0–15.0)
Immature Granulocytes: 0 %
Lymphocytes Relative: 17 %
Lymphs Abs: 1.2 10*3/uL (ref 0.7–4.0)
MCH: 30.5 pg (ref 26.0–34.0)
MCHC: 34.9 g/dL (ref 30.0–36.0)
MCV: 87.4 fL (ref 80.0–100.0)
Monocytes Absolute: 0.5 10*3/uL (ref 0.1–1.0)
Monocytes Relative: 6 %
Neutro Abs: 5.5 10*3/uL (ref 1.7–7.7)
Neutrophils Relative %: 75 %
Platelets: 359 10*3/uL (ref 150–400)
RBC: 4.69 MIL/uL (ref 3.87–5.11)
RDW: 13.5 % (ref 11.5–15.5)
WBC: 7.3 10*3/uL (ref 4.0–10.5)
nRBC: 0 % (ref 0.0–0.2)

## 2021-09-28 LAB — COMPREHENSIVE METABOLIC PANEL
ALT: 32 U/L (ref 0–44)
AST: 19 U/L (ref 15–41)
Albumin: 4.2 g/dL (ref 3.5–5.0)
Alkaline Phosphatase: 62 U/L (ref 38–126)
Anion gap: 7 (ref 5–15)
BUN: 11 mg/dL (ref 6–20)
CO2: 27 mmol/L (ref 22–32)
Calcium: 9.2 mg/dL (ref 8.9–10.3)
Chloride: 104 mmol/L (ref 98–111)
Creatinine, Ser: 0.85 mg/dL (ref 0.44–1.00)
GFR, Estimated: >60 mL/min (ref >60)
Glucose, Bld: 99 mg/dL (ref 70–99)
Potassium: 4 mmol/L (ref 3.5–5.1)
Sodium: 138 mmol/L (ref 135–145)
Total Bilirubin: 0.4 mg/dL (ref 0.3–1.2)
Total Protein: 7.9 g/dL (ref 6.5–8.1)

## 2021-09-28 LAB — URINALYSIS, ROUTINE W REFLEX MICROSCOPIC
Bilirubin Urine: NEGATIVE
Glucose, UA: NEGATIVE mg/dL
Hgb urine dipstick: NEGATIVE
Ketones, ur: NEGATIVE mg/dL
Leukocytes,Ua: NEGATIVE
Nitrite: NEGATIVE
Protein, ur: 30 mg/dL — AB
Specific Gravity, Urine: 1.013 (ref 1.005–1.030)
pH: 6 (ref 5.0–8.0)

## 2021-09-28 LAB — HCG, QUANTITATIVE, PREGNANCY: hCG, Beta Chain, Quant, S: <1 m[IU]/mL (ref <5)

## 2021-09-28 LAB — LIPASE, BLOOD: Lipase: 83 U/L — ABNORMAL HIGH (ref 11–51)

## 2021-09-28 LAB — SARS CORONAVIRUS 2 BY RT PCR: SARS Coronavirus 2 by RT PCR: NEGATIVE

## 2021-09-28 MED ORDER — SODIUM CHLORIDE 0.9 % IV SOLN: INTRAVENOUS | Status: DC

## 2021-09-28 MED ORDER — ONDANSETRON 4 MG PO TBDP: 4.0000 mg | ORAL_TABLET | Freq: Four times a day (QID) | ORAL | Status: DC | PRN

## 2021-09-28 MED ORDER — ONDANSETRON HCL 4 MG/2ML IJ SOLN
4.0000 mg | Freq: Once | INTRAMUSCULAR | Status: AC
  Administered 2021-09-28: 4 mg via INTRAVENOUS
  Filled 2021-09-28: qty 2

## 2021-09-28 MED ORDER — ENOXAPARIN SODIUM 40 MG/0.4ML IJ SOSY
40.0000 mg | PREFILLED_SYRINGE | INTRAMUSCULAR | Status: DC
  Administered 2021-09-29 (×2): 40 mg via SUBCUTANEOUS
  Filled 2021-09-28 (×2): qty 0.4

## 2021-09-28 MED ORDER — OXYCODONE HCL 5 MG PO TABS
5.0000 mg | ORAL_TABLET | ORAL | Status: DC | PRN
  Administered 2021-09-29 (×2): 5 mg via ORAL
  Filled 2021-09-28 (×2): qty 1

## 2021-09-28 MED ORDER — MORPHINE SULFATE (PF) 2 MG/ML IV SOLN: 2.0000 mg | INTRAVENOUS | Status: DC | PRN

## 2021-09-28 MED ORDER — ONDANSETRON HCL 4 MG/2ML IJ SOLN: 4.0000 mg | Freq: Four times a day (QID) | INTRAMUSCULAR | Status: DC | PRN

## 2021-09-28 MED ORDER — IOHEXOL 300 MG/ML  SOLN
100.0000 mL | Freq: Once | INTRAMUSCULAR | Status: AC | PRN
  Administered 2021-09-28: 100 mL via INTRAVENOUS

## 2021-09-28 MED ORDER — ACETAMINOPHEN 650 MG RE SUPP: 650.0000 mg | Freq: Four times a day (QID) | RECTAL | Status: DC | PRN

## 2021-09-28 MED ORDER — MORPHINE SULFATE (PF) 4 MG/ML IV SOLN
4.0000 mg | Freq: Once | INTRAVENOUS | Status: AC
  Administered 2021-09-28: 4 mg via INTRAVENOUS
  Filled 2021-09-28: qty 1

## 2021-09-28 MED ORDER — MUPIROCIN 2 % EX OINT
1.0000 | TOPICAL_OINTMENT | Freq: Two times a day (BID) | CUTANEOUS | Status: DC
  Administered 2021-09-29 (×2): 1 via NASAL
  Filled 2021-09-28: qty 22

## 2021-09-28 MED ORDER — ACETAMINOPHEN 325 MG PO TABS: 650.0000 mg | ORAL_TABLET | Freq: Four times a day (QID) | ORAL | Status: DC | PRN

## 2021-09-28 MED ORDER — SODIUM CHLORIDE 0.9 % IV SOLN
2.0000 g | Freq: Once | INTRAVENOUS | Status: AC
  Administered 2021-09-28: 2 g via INTRAVENOUS
  Filled 2021-09-28: qty 20

## 2021-09-28 NOTE — ED Provider Notes (Signed)
[facility]-EMERGENCY DEPT Provider Note   CSN: [id number] Arrival date & time: 09/28/21  1142     History  Chief Complaint  Patient presents with   Abdominal Pain    Debra Strong is a 36 y.o. female.  The history is provided by the patient and medical records. No language interpreter was used.  Abdominal Pain    Patient is a 36 year old female presenting with a chief complaint of abdominal pain. Patient states her pain started two days ago when her menstrual cycle ended. The pain is intermittent and does not seem to come and go at certain times of the day, She states the pain is in her upper abdomen as well as her lower chest and her ribs. She describes the pain as "sharp contractions" and states it feels like it is pulsating. She tried to take Excedrin and states it temporarily eased her pain. She states holding her stomach and rocking side to side seems to slightly ease the pain. She denies fever, chills, n/v/d, constipation,  Home Medications Prior to Admission medications   Medication Sig Start Date End Date Taking? Authorizing Provider  chlorthalidone (HYGROTON) 25 MG tablet TAKE 1 TABLET(25 MG) BY MOUTH DAILY 09/13/21   Doreene Eland, MD  cyclobenzaprine (FLEXERIL) 10 MG tablet Take 1 tablet (10 mg total) by mouth 2 (two) times daily as needed for muscle spasms. Patient not taking: Reported on 06/05/2021 05/21/21   Wynetta Fines, MD  sertraline (ZOLOFT) 50 MG tablet Take 1 tablet (50 mg total) by mouth daily. 04/11/21   Littie Deeds, MD      Allergies    Amlodipine, Lisinopril, Shellfish allergy, and Latex    Review of Systems   Review of Systems  Gastrointestinal:  Positive for abdominal pain.  All other systems reviewed and are negative.   Physical Exam Updated Vital Signs BP (!) 178/119 (BP Location: Left Arm)   Pulse 72   Temp 98.1 F (36.7 C) (Oral)   Resp 18   Ht 5\' 2"  (1.575 m)   Wt 93 kg   LMP 09/26/2021 (Exact Date)    SpO2 100%   BMI 37.49 kg/m  Physical Exam Vitals and nursing note reviewed.  Constitutional:      General: She is not in acute distress.    Appearance: She is well-developed. She is obese.  HENT:     Head: Atraumatic.  Eyes:     Conjunctiva/sclera: Conjunctivae normal.  Cardiovascular:     Rate and Rhythm: Normal rate and regular rhythm.  Pulmonary:     Effort: Pulmonary effort is normal.  Abdominal:     General: Bowel sounds are normal.     Tenderness: There is abdominal tenderness in the right upper quadrant, epigastric area and left upper quadrant. There is no guarding or rebound.  Musculoskeletal:     Cervical back: Neck supple.  Skin:    Findings: No rash.  Neurological:     Mental Status: She is alert.  Psychiatric:        Mood and Affect: Mood normal.     ED Results / Procedures / Treatments   Labs (all labs ordered are listed, but only abnormal results are displayed) Labs Reviewed  LIPASE, BLOOD - Abnormal; Notable for the following components:      Result Value   Lipase 83 (*)    All other components within normal limits  URINALYSIS, ROUTINE W REFLEX MICROSCOPIC - Abnormal; Notable for the following components:  Protein, ur 30 (*)    Bacteria, UA RARE (*)    All other components within normal limits  SARS CORONAVIRUS 2 BY RT PCR  CBC WITH DIFFERENTIAL/PLATELET  COMPREHENSIVE METABOLIC PANEL  HCG, QUANTITATIVE, PREGNANCY    EKG None  Radiology US Abdomen Limited  Result Date: 09/28/2021 CLINICAL DATA:  Right upper quadrant pain EXAM: ULTRASOUND ABDOMEN LIMITED RIGHT UPPER QUADRANT COMPARISON:  Same day CT abdomen and pelvis FINDINGS: Gallbladder: Negative sonographic Murphy's sign. Cholelithiasis with largest stone measuring 9 mm in the gallbladder neck. Increased gallbladder wall thickening measuring 7 mm. Common bile duct: Diameter: 3 mm.  No intrahepatic biliary dilation. Liver: No focal lesion identified. Hepatic steatosis with areas of focal fatty  sparing. Portal vein is patent on color Doppler imaging with normal direction of blood flow towards the liver. Other: None. IMPRESSION: Cholelithiasis with findings suggestive of acute cholecystitis. Electronically Signed   By: Minerva Festeryler  Stutzman M.D.   On: 09/28/2021 20:16   CT ABDOMEN PELVIS W CONTRAST  Result Date: 09/28/2021 CLINICAL DATA:  Biliary obstruction suspected. Pain across chest and abdomen. EXAM: CT ABDOMEN AND PELVIS WITH CONTRAST TECHNIQUE: Multidetector CT imaging of the abdomen and pelvis was performed using the standard protocol following bolus administration of intravenous contrast. RADIATION DOSE REDUCTION: This exam was performed according to the departmental dose-optimization program which includes automated exposure control, adjustment of the mA and/or kV according to patient size and/or use of iterative reconstruction technique. CONTRAST:  100mL OMNIPAQUE IOHEXOL 300 MG/ML  SOLN COMPARISON:  None Available. FINDINGS: Lower chest: The lung bases are clear without focal nodule, mass, or airspace disease. Heart size is normal. No significant pleural or pericardial effusion is present. Hepatobiliary: Diffuse fatty infiltration of the liver is present. No discrete lesions are present. Inflammatory changes are present about the gallbladder with enhancement of the mucosa. No definite stones are present. The common bile duct is of normal size. Some inflammatory changes present along the cystic duct. Pancreas: Unremarkable. No pancreatic ductal dilatation or surrounding inflammatory changes. Spleen: Normal in size without focal abnormality. Adrenals/Urinary Tract: Adrenal glands are normal bilaterally. Kidneys and ureters are normal. No stone or mass lesion is present. No obstruction is present. The urinary bladder is within normal limits. Stomach/Bowel: The stomach and duodenum are within normal limits. Small bowel is within normal limits. Terminal ileum is normal. The appendix is not discretely  visualized and may be surgically absent. No inflammatory changes are associated. The ascending and transverse colon are within normal limits. The descending and sigmoid colon are normal. Vascular/Lymphatic: No significant vascular findings are present. No enlarged abdominal or pelvic lymph nodes. Reproductive: Uterus and bilateral adnexa are unremarkable. Other: No abdominal wall hernia or abnormality. No abdominopelvic ascites. Musculoskeletal: Vertebral body heights and alignment are normal. No focal lytic or blastic lesions are present. Mild disc bulging is present at L4-5. Bony pelvis within normal limits. The hips are located and normal. Bone island is present in the right femoral neck. IMPRESSION: 1. Inflammatory changes about the gallbladder with enhancement of the mucosa. No definite stones are present. Findings are consistent with acute cholecystitis. 2. Hepatic steatosis. 3. No other acute or focal lesion to explain the patient's symptoms. Electronically Signed   By: Marin Robertshristopher  Mattern M.D.   On: 09/28/2021 18:49    Procedures Procedures    Medications Ordered in ED Medications  morphine (PF) 4 MG/ML injection 4 mg (4 mg Intravenous Given 09/28/21 1634)  ondansetron (ZOFRAN) injection 4 mg (4 mg Intravenous Given 09/28/21 1633)  iohexol (OMNIPAQUE) 300 MG/ML solution 100 mL (100 mLs Intravenous Contrast Given 09/28/21 1830)    ED Course/ Medical Decision Making/ A&P                           Medical Decision Making Amount and/or Complexity of Data Reviewed Labs: ordered. Radiology: ordered.  Risk Prescription drug management.   BP (!) 178/119 (BP Location: Left Arm)   Pulse 72   Temp 98.1 F (36.7 C) (Oral)   Resp 18   Ht 5\' 2"  (1.575 m)   Wt 93 kg   LMP 09/26/2021 (Exact Date)   SpO2 100%   BMI 37.49 kg/m   4:24 PM This is a 36 year old female who presents with complaint of abdominal pain.  She endorsed having pain across the upper abdomen to her chest and back ongoing  for the past 3 days.  She endorsed decrease in appetite and afraid to eat.  She does complain of some chills and states she overall does not feel well.  Pain is moderate in intensity and persistent.  No aggravation of alleviating factor.  No productive cough vomiting or diarrhea or dysuria.  She just came off her menstruation.  She mention being seen at [facility] yesterday for complaint.  States she had EKG blood work and chest x-ray done but no definitive diagnosis was made.  Symptoms still persistent.  She does consume tobacco but denies alcohol use.  No history of diabetes.  On exam patient is tearful.  She has tenderness to her epigastric right upper and left upper quadrant on palpation without guarding or rebound tenderness.  Heart lung sounds normal.  Vital signs remarkable for elevated blood pressure of 178/119.  She is afebrile and no hypoxia.  Labs obtained independently viewed interpreted by me.  Lab remarkable for elevated lipase of 83.  Urinalysis without signs of urine tract infection, normal WBC, normal H&H, electrolyte overall reassuring and COVID test obtained and is negative.  Given her upper abdominal discomfort, elevated lipase, will obtain abdominal pelvis CT scan for further assessment of her condition.  Patient given morphine for pain control and Zofran for nausea.  7:39 PM Labs and imaging obtained independently viewed interpreted by me and I agree with radiologist interpretation.  Urinalysis without signs of urinary tract infection, normal WBC, normal H&H, electrolyte panels are reassuring except for elevated lipase of 83.  Pregnancy test is negative.  COVID test is negative.  An abdominal pelvis CT scan obtainedI have independently reviewed the CT which shows inflammatory changes about the gallbladder with enhancement of the mucosa but no definitive stones.  Finding is consistent with acute cholecystitis.  No evidence of pancreatitis.  Plan to consult general surgery  for their involvement.  Patient made aware of symptoms.  7:43 PM Appreciate consultation from general surgeon Dr. Donne Hazel who request for a limited abdominal ultrasound to be obtained for further assessment.  8:54 PM Limited abdominal ultrasound obtained independently visualized and interpreted by me and I agree with radiologist interpretation.  Ultrasound demonstrated evidence of cholelithiasis with findings suggestive of acute cholecystitis.  I will notify general surgery to have patient admitted.  9:03 PM General surgeon Dr. Donne Hazel did reviewed the ultrasound results and will admit patient for further management.  He requests antibiotic to be given.  We will give patient 2 g of Rocephin.  This patient presents to the ED for concern of abd pain, this involves an extensive number of treatment options,  and is a complaint that carries with it a high risk of complications and morbidity.  The differential diagnosis includes cholecystitis, pancreatitis, gastritis, PNA, colitis, diverticulitis, appendicitis, kidney stone, UTI, pyelonephritis  Co morbidities that complicate the patient evaluation asthma  HTN Additional history obtained:  Additional history obtained from patient External records from outside source obtained and reviewed including EMR including prior labs and imaging  Lab Tests:  I Ordered, and personally interpreted labs.  The pertinent results include:  as above  Imaging Studies ordered:  I ordered imaging studies including limited abd [place] I independently visualized and interpreted imaging which showed acute cholecystitis I agree with the radiologist interpretation  Cardiac Monitoring:  The patient was maintained on a cardiac monitor.  I personally viewed and interpreted the cardiac monitored which showed an underlying rhythm of: NSR  Medicines ordered and prescription drug management:  I ordered medication including morphine  for pain Reevaluation of the patient  after these medicines showed that the patient improved I have reviewed the patients home medicines and have made adjustments as needed  Test Considered: as above  Critical Interventions: IV abx  IV opiate  ivf  Consultations Obtained:  I requested consultation with the surgeon Dr. Dwain Sarna,  and discussed lab and imaging findings as well as pertinent plan - they recommend: admission  Problem List / ED Course: acute cholecystitis  Reevaluation:  After the interventions noted above, I reevaluated the patient and found that they have :improved  Social Determinants of Health: depression  Dispostion:  After consideration of the diagnostic results and the patients response to treatment, I feel that the patent would benefit from admission.         Final Clinical Impression(s) / ED Diagnoses Final diagnoses:  Calculus of gallbladder with acute cholecystitis without obstruction    Rx / DC Orders ED Discharge Orders     None         Fayrene Helper, PA-C 09/28/21 2106    Loetta Rough, MD 09/28/21 [phone]

## 2021-09-28 NOTE — ED Triage Notes (Signed)
Patient c/o generalized abdominal pain that radiates into the breast area x 2 days. Patient states she went to [facility] yesterday and had an EKG, blood work, and  CXR and x-ray of ribs.

## 2021-09-28 NOTE — ED Notes (Signed)
Patient transported to CT 

## 2021-09-28 NOTE — H&P (Signed)
Debra Strong is an 36 y.o. female.   Chief Complaint: ab pain HPI: [age] yof with 48 hours of ruq pain radiating to back. Never happened  before. Was intermittent now constant. Eating makes it worse.  One episode emesis, some nausea, not eating much now.  Having nl bms,. No fevers.  Nothing was helping at home led her to come to er.  She is noted to have gallstones and likely cholecystitis and I was called to see her.     Past Medical History:  Diagnosis Date   Asthma    Hypertension    Pregnancy induced hypertension     Past Surgical History:  Procedure Laterality Date   MOUTH SURGERY     NO PAST SURGERIES     WISDOM TOOTH EXTRACTION      Family History  Problem Relation Age of Onset   Hypertension Mother    Diabetes Father    Hypertension Father    Social History:  reports that she has quit smoking. Her smoking use included cigarettes. She has never used smokeless tobacco. She reports current drug use. Drug: Marijuana. She reports that she does not drink alcohol.  Allergies:  Allergies  Allergen Reactions   Amlodipine Palpitations    Pt states she was taking anxiety medication as well and does not know which was causing the heart palpations.   Lisinopril Nausea Only and Other (See Comments)    Dizziness   Shellfish Allergy Anaphylaxis   Latex     (Not in a hospital admission)   Results for orders placed or performed during the hospital encounter of 09/28/21 (from the past 48 hour(s))  SARS Coronavirus 2 by RT PCR (hospital order, performed in [facility] hospital lab) *cepheid single result test* Anterior Nasal Swab     Status: None   Collection Time: 09/28/21  1:27 PM   Specimen: Anterior Nasal Swab  Result Value Ref Range   SARS Coronavirus 2 by RT PCR NEGATIVE NEGATIVE    Comment: (NOTE) SARS-CoV-2 target nucleic acids are NOT DETECTED.  The SARS-CoV-2 RNA is generally detectable in upper and lower respiratory specimens during the acute phase of  infection. The lowest concentration of SARS-CoV-2 viral copies this assay can detect is 250 copies / mL. A negative result does not preclude SARS-CoV-2 infection and should not be used as the sole basis for treatment or other patient management decisions.  A negative result may occur with improper specimen collection / handling, submission of specimen other than nasopharyngeal swab, presence of viral mutation(s) within the areas targeted by this assay, and inadequate number of viral copies (<250 copies / mL). A negative result must be combined with clinical observations, patient history, and epidemiological information.  Fact Sheet for Patients:   RoadLapTop.co.zahttps://www.fda.gov/media/158405/download  Fact Sheet for Healthcare Providers: http://kim-miller.com/https://www.fda.gov/media/158404/download  This test is not yet approved or  cleared by the [place] FDA and has been authorized for detection and/or diagnosis of SARS-CoV-2 by FDA under an Emergency Use Authorization (EUA).  This EUA will remain in effect (meaning this test can be used) for the duration of the COVID-19 declaration under Section 564(b)(1) of the Act, 21 U.S.C. section 360bbb-3(b)(1), unless the authorization is terminated or revoked sooner.  Performed at [facility], [address] [place], [place] [address]   CBC with Differential     Status: None   Collection Time: 09/28/21  1:34 PM  Result Value Ref Range   WBC 7.3 4.0 - 10.5 K/uL   RBC  4.69 3.87 - 5.11 MIL/uL   Hemoglobin 14.3 12.0 - 15.0 g/dL   HCT 41.0 36.0 - 46.0 %   MCV 87.4 80.0 - 100.0 fL   MCH 30.5 26.0 - 34.0 pg   MCHC 34.9 30.0 - 36.0 g/dL   RDW 13.5 11.5 - 15.5 %   Platelets 359 150 - 400 K/uL   nRBC 0.0 0.0 - 0.2 %   Neutrophils Relative % 75 %   Neutro Abs 5.5 1.7 - 7.7 K/uL   Lymphocytes Relative 17 %   Lymphs Abs 1.2 0.7 - 4.0 K/uL   Monocytes Relative 6 %   Monocytes Absolute 0.5 0.1 - 1.0 K/uL   Eosinophils Relative 2 %   Eosinophils  Absolute 0.1 0.0 - 0.5 K/uL   Basophils Relative 0 %   Basophils Absolute 0.0 0.0 - 0.1 K/uL   Immature Granulocytes 0 %   Abs Immature Granulocytes 0.02 0.00 - 0.07 K/uL    Comment: Performed at [facility], [place] [address], [place], [place] [address]  Comprehensive metabolic panel     Status: None   Collection Time: 09/28/21  1:34 PM  Result Value Ref Range   Sodium 138 135 - 145 mmol/L   Potassium 4.0 3.5 - 5.1 mmol/L   Chloride 104 98 - 111 mmol/L   CO2 27 22 - 32 mmol/L   Glucose, Bld 99 70 - 99 mg/dL    Comment: Glucose reference range applies only to samples taken after fasting for at least 8 hours.   BUN 11 6 - 20 mg/dL   Creatinine, Ser 0.85 0.44 - 1.00 mg/dL   Calcium 9.2 8.9 - 10.3 mg/dL   Total Protein 7.9 6.5 - 8.1 g/dL   Albumin 4.2 3.5 - 5.0 g/dL   AST 19 15 - 41 U/L   ALT 32 0 - 44 U/L   Alkaline Phosphatase 62 38 - 126 U/L   Total Bilirubin 0.4 0.3 - 1.2 mg/dL   GFR, Estimated >60 >60 mL/min    Comment: (NOTE) Calculated using the CKD-EPI Creatinine Equation (2021)    Anion gap 7 5 - 15    Comment: Performed at [facility], [place] [address] [place], [place] [address]  Lipase, blood     Status: Abnormal   Collection Time: 09/28/21  1:34 PM  Result Value Ref Range   Lipase 83 (H) 11 - 51 U/L    Comment: Performed at [facility], [place] [address] [place], [place] [address]  hCG, quantitative, pregnancy     Status: None   Collection Time: 09/28/21  1:34 PM  Result Value Ref Range   hCG, Beta Chain, Quant, S <1 <5 mIU/mL    Comment:          GEST. AGE      CONC.  (mIU/mL)   <=1 WEEK        5 - 50     2 WEEKS       50 - 500     3 WEEKS       100 - 10,000     4 WEEKS     1,000 - 30,000     5 WEEKS     3,500 - 115,000   6-8 WEEKS     12,000 - 270,000    12 WEEKS     15,000 - 220,000        FEMALE AND NON-PREGNANT FEMALE:     LESS THAN 5 mIU/mL Performed  at [facility], [address] [place], [place] [address]   Urinalysis, Routine w reflex microscopic Urine, Clean Catch     Status: Abnormal   Collection Time: 09/28/21  2:34 PM  Result Value Ref Range   Color, Urine YELLOW YELLOW   APPearance CLEAR CLEAR   Specific Gravity, Urine 1.013 1.005 - 1.030   pH 6.0 5.0 - 8.0   Glucose, UA NEGATIVE NEGATIVE mg/dL   Hgb urine dipstick NEGATIVE NEGATIVE   Bilirubin Urine NEGATIVE NEGATIVE   Ketones, ur NEGATIVE NEGATIVE mg/dL   Protein, ur 30 (A) NEGATIVE mg/dL   Nitrite NEGATIVE NEGATIVE   Leukocytes,Ua NEGATIVE NEGATIVE   RBC / HPF 0-5 0 - 5 RBC/hpf   WBC, UA 0-5 0 - 5 WBC/hpf   Bacteria, UA RARE (A) NONE SEEN   Squamous Epithelial / LPF 0-5 0 - 5   Mucus PRESENT     Comment: Performed at [facility], [address], [place], [place] [address]   US Abdomen Limited  Result Date: 09/28/2021 CLINICAL DATA:  Right upper quadrant pain EXAM: ULTRASOUND ABDOMEN LIMITED RIGHT UPPER QUADRANT COMPARISON:  Same day CT abdomen and pelvis FINDINGS: Gallbladder: Negative sonographic Murphy's sign. Cholelithiasis with largest stone measuring 9 mm in the gallbladder neck. Increased gallbladder wall thickening measuring 7 mm. Common bile duct: Diameter: 3 mm.  No intrahepatic biliary dilation. Liver: No focal lesion identified. Hepatic steatosis with areas of focal fatty sparing. Portal vein is patent on color Doppler imaging with normal direction of blood flow towards the liver. Other: None. IMPRESSION: Cholelithiasis with findings suggestive of acute cholecystitis. Electronically Signed   By: Minerva Fester M.D.   On: 09/28/2021 20:16   CT ABDOMEN PELVIS W CONTRAST  Result Date: 09/28/2021 CLINICAL DATA:  Biliary obstruction suspected. Pain across chest and abdomen. EXAM: CT ABDOMEN AND PELVIS WITH CONTRAST TECHNIQUE: Multidetector CT imaging of the abdomen and pelvis was performed using the standard protocol following bolus administration of intravenous  contrast. RADIATION DOSE REDUCTION: This exam was performed according to the departmental dose-optimization program which includes automated exposure control, adjustment of the mA and/or kV according to patient size and/or use of iterative reconstruction technique. CONTRAST:  OMNIPAQUE IOHEXOL 300 MG/ML  SOLN COMPARISON:  None Available. FINDINGS: Lower chest: The lung bases are clear without focal nodule, mass, or airspace disease. Heart size is normal. No significant pleural or pericardial effusion is present. Hepatobiliary: Diffuse fatty infiltration of the liver is present. No discrete lesions are present. Inflammatory changes are present about the gallbladder with enhancement of the mucosa. No definite stones are present. The common bile duct is of normal size. Some inflammatory changes present along the cystic duct. Pancreas: Unremarkable. No pancreatic ductal dilatation or surrounding inflammatory changes. Spleen: Normal in size without focal abnormality. Adrenals/Urinary Tract: Adrenal glands are normal bilaterally. Kidneys and ureters are normal. No stone or mass lesion is present. No obstruction is present. The urinary bladder is within normal limits. Stomach/Bowel: The stomach and duodenum are within normal limits. Small bowel is within normal limits. Terminal ileum is normal. The appendix is not discretely visualized and may be surgically absent. No inflammatory changes are associated. The ascending and transverse colon are within normal limits. The descending and sigmoid colon are normal. Vascular/Lymphatic: No significant vascular findings are present. No enlarged abdominal or pelvic lymph nodes. Reproductive: Uterus and bilateral adnexa are unremarkable. Other: No abdominal wall hernia or abnormality. No abdominopelvic ascites. Musculoskeletal: Vertebral body heights and alignment  are normal. No focal lytic or blastic lesions are present. Mild disc bulging is present at L4-5. Bony pelvis within  normal limits. The hips are located and normal. Bone island is present in the right femoral neck. IMPRESSION: 1. Inflammatory changes about the gallbladder with enhancement of the mucosa. No definite stones are present. Findings are consistent with acute cholecystitis. 2. Hepatic steatosis. 3. No other acute or focal lesion to explain the patient's symptoms. Electronically Signed   By: Marin Roberts M.D.   On: 09/28/2021 18:49    Review of Systems  Constitutional:  Positive for appetite change. Negative for fever.  Gastrointestinal:  Positive for abdominal pain, nausea and vomiting.  All other systems reviewed and are negative.   Blood pressure (!) 153/113, pulse 67, temperature 98 F (36.7 C), temperature source Oral, resp. rate 17, height 5\' 2"  (1.575 m), weight 93 kg, last menstrual period 09/26/2021, SpO2 99 %. Physical Exam Vitals reviewed.  Constitutional:      Appearance: She is well-developed.  Eyes:     General: No scleral icterus. Cardiovascular:     Rate and Rhythm: Normal rate and regular rhythm.  Pulmonary:     Effort: Pulmonary effort is normal.  Abdominal:     General: There is no distension.     Palpations: Abdomen is soft.     Tenderness: There is abdominal tenderness in the right upper quadrant. Positive signs include Murphy's sign.  Skin:    General: Skin is warm and dry.     Capillary Refill: Capillary refill takes less than 2 seconds.     Coloration: Skin is not jaundiced.  Neurological:     General: No focal deficit present.     Mental Status: She is alert.  Psychiatric:        Mood and Affect: Mood normal.        Behavior: Behavior normal.      Assessment/Plan Cholecystitis -abx, npo after mn, plan for lap chole in am -I discussed the procedure in detail.  Discussed pathophysiology of gb disease and lack of other good alternatives. We discussed the risks and benefits of a laparoscopic cholecystectomy and possible cholangiogram including, but not  limited to bleeding, infection, injury to surrounding structures such as the intestine or liver, bile leak, retained gallstones, need to convert to an open procedure, prolonged diarrhea, blood clots such as  DVT, common bile duct injury, anesthesia risks, and possible need for additional procedures.  The likelihood of improvement in symptoms and return to the patient's normal status is good. We discussed the typical post-operative recovery course.  I reveiwed her cbc, cmp.  Reviewed 09/28/2021 and ct scan independently and concur. Discussed case with ER PA and posted for surgery in am   [place], MD 09/28/2021, 10:02 PM

## 2021-09-28 NOTE — ED Provider Triage Note (Signed)
Emergency Medicine Provider Triage Evaluation Note  Debra Strong , a 36 y.o. female  was evaluated in triage.  Pt complains of pain. Report pain across her chest her abdomen and her back x 3 days.  Feeling like "I'm gonna fall out".  No fever, chills, runny nose, sneeze, cough, sore throat, sob dysuria, vaginal bleeding or discharge.  Endorse nausea.  Seen at [facility] yesterday and had cxr and blood work done  Review of Systems  Positive: As above Negative: As above  Physical Exam  BP (!) 179/123 (BP Location: Left Arm)   Pulse 94   Temp 98.1 F (36.7 C) (Oral)   Resp 18   Ht 5\' 2"  (1.575 m)   Wt 93 kg   LMP 09/26/2021 (Exact Date)   SpO2 95%   BMI 37.49 kg/m  Gen:   Awake, no distress   Resp:  Normal effort  MSK:   Moves extremities without difficulty  Other:    Medical Decision Making  Medically screening exam initiated at 1:07 PM.  Appropriate orders placed.  Debra Strong was informed that the remainder of the evaluation will be completed by another provider, this initial triage assessment does not replace that evaluation, and the importance of remaining in the ED until their evaluation is complete.     Debra Moras, PA-C 09/28/21 1309

## 2021-09-29 ENCOUNTER — Observation Stay (HOSPITAL_BASED_OUTPATIENT_CLINIC_OR_DEPARTMENT_OTHER): Payer: Medicaid Other | Admitting: Anesthesiology

## 2021-09-29 ENCOUNTER — Encounter (HOSPITAL_COMMUNITY)
Admission: EM | Disposition: A | Discharge status: Home / Self Care | Payer: Self-pay | Source: Home / Self Care | Attending: Emergency Medicine

## 2021-09-29 ENCOUNTER — Observation Stay (HOSPITAL_COMMUNITY): Payer: Medicaid Other | Admitting: Anesthesiology

## 2021-09-29 DIAGNOSIS — K810 Acute cholecystitis: Secondary | ICD-10-CM | POA: Diagnosis not present

## 2021-09-29 HISTORY — PX: CHOLECYSTECTOMY: SHX55

## 2021-09-29 LAB — SURGICAL PCR SCREEN
MRSA, PCR: NEGATIVE
Staphylococcus aureus: NEGATIVE

## 2021-09-29 SURGERY — LAPAROSCOPIC CHOLECYSTECTOMY WITH INTRAOPERATIVE CHOLANGIOGRAM
Anesthesia: General | Site: Abdomen

## 2021-09-29 MED ORDER — DEXAMETHASONE SODIUM PHOSPHATE 4 MG/ML IJ SOLN
INTRAMUSCULAR | Status: DC | PRN
  Administered 2021-09-29: 10 mg via INTRAVENOUS

## 2021-09-29 MED ORDER — OXYCODONE HCL 5 MG/5ML PO SOLN: 5.0000 mg | Freq: Once | ORAL | Status: DC | PRN

## 2021-09-29 MED ORDER — LACTATED RINGERS IV SOLN: INTRAVENOUS | Status: DC | PRN

## 2021-09-29 MED ORDER — KETAMINE HCL 10 MG/ML IJ SOLN
INTRAMUSCULAR | Status: DC | PRN
  Administered 2021-09-29: 30 mg via INTRAVENOUS

## 2021-09-29 MED ORDER — LACTATED RINGERS IR SOLN
Status: DC | PRN
  Administered 2021-09-29: 1000 mL

## 2021-09-29 MED ORDER — OXYCODONE HCL 5 MG PO TABS: 5.0000 mg | ORAL_TABLET | ORAL | 0 refills | Status: DC | PRN

## 2021-09-29 MED ORDER — OXYCODONE HCL 5 MG PO TABS: 5.0000 mg | ORAL_TABLET | Freq: Once | ORAL | Status: DC | PRN

## 2021-09-29 MED ORDER — ALBUTEROL SULFATE HFA 108 (90 BASE) MCG/ACT IN AERS
INHALATION_SPRAY | RESPIRATORY_TRACT | Status: AC
  Filled 2021-09-29: qty 6.7

## 2021-09-29 MED ORDER — BUPIVACAINE-EPINEPHRINE 0.25% -1:200000 IJ SOLN
INTRAMUSCULAR | Status: DC | PRN
  Administered 2021-09-29: 15 mL

## 2021-09-29 MED ORDER — PHENYLEPHRINE 80 MCG/ML (10ML) SYRINGE FOR IV PUSH (FOR BLOOD PRESSURE SUPPORT)
PREFILLED_SYRINGE | INTRAVENOUS | Status: AC
  Filled 2021-09-29: qty 10

## 2021-09-29 MED ORDER — PROMETHAZINE HCL 25 MG/ML IJ SOLN: 6.2500 mg | INTRAMUSCULAR | Status: DC | PRN

## 2021-09-29 MED ORDER — ACETAMINOPHEN 500 MG PO TABS
1000.0000 mg | ORAL_TABLET | Freq: Four times a day (QID) | ORAL | Status: DC
  Administered 2021-09-29 (×2): 1000 mg via ORAL
  Administered 2021-09-30: 500 mg via ORAL
  Filled 2021-09-29 (×4): qty 2

## 2021-09-29 MED ORDER — MIDAZOLAM HCL 2 MG/2ML IJ SOLN
INTRAMUSCULAR | Status: AC
  Filled 2021-09-29: qty 2

## 2021-09-29 MED ORDER — PHENYLEPHRINE 80 MCG/ML (10ML) SYRINGE FOR IV PUSH (FOR BLOOD PRESSURE SUPPORT)
PREFILLED_SYRINGE | INTRAVENOUS | Status: DC | PRN
  Administered 2021-09-29 (×2): 120 ug via INTRAVENOUS

## 2021-09-29 MED ORDER — METRONIDAZOLE 500 MG PO TABS
500.0000 mg | ORAL_TABLET | Freq: Two times a day (BID) | ORAL | Status: DC
  Administered 2021-09-29 – 2021-09-30 (×3): 500 mg via ORAL
  Filled 2021-09-29 (×3): qty 1

## 2021-09-29 MED ORDER — CELECOXIB 200 MG PO CAPS
200.0000 mg | ORAL_CAPSULE | Freq: Once | ORAL | Status: AC
  Administered 2021-09-29: 200 mg via ORAL

## 2021-09-29 MED ORDER — SUGAMMADEX SODIUM 500 MG/5ML IV SOLN
INTRAVENOUS | Status: AC
  Filled 2021-09-29: qty 5

## 2021-09-29 MED ORDER — BUPIVACAINE-EPINEPHRINE (PF) 0.25% -1:200000 IJ SOLN
INTRAMUSCULAR | Status: AC
  Filled 2021-09-29: qty 30

## 2021-09-29 MED ORDER — FENTANYL CITRATE (PF) 250 MCG/5ML IJ SOLN
INTRAMUSCULAR | Status: AC
  Filled 2021-09-29: qty 5

## 2021-09-29 MED ORDER — INDOCYANINE GREEN 25 MG IV SOLR
1.2500 mg | Freq: Once | INTRAVENOUS | Status: DC
  Filled 2021-09-29: qty 10

## 2021-09-29 MED ORDER — SODIUM CHLORIDE 0.9 % IV SOLN: INTRAVENOUS | Status: DC

## 2021-09-29 MED ORDER — CELECOXIB 200 MG PO CAPS
ORAL_CAPSULE | ORAL | Status: AC
  Filled 2021-09-29: qty 1

## 2021-09-29 MED ORDER — SPY AGENT GREEN - (INDOCYANINE FOR INJECTION)
1.2500 mg | Freq: Once | INTRAMUSCULAR | Status: DC
  Filled 2021-09-29 (×2): qty 10

## 2021-09-29 MED ORDER — SCOPOLAMINE 1 MG/3DAYS TD PT72
MEDICATED_PATCH | TRANSDERMAL | Status: AC
  Filled 2021-09-29: qty 1

## 2021-09-29 MED ORDER — LIDOCAINE HCL (PF) 2 % IJ SOLN
INTRAMUSCULAR | Status: AC
  Filled 2021-09-29: qty 5

## 2021-09-29 MED ORDER — SCOPOLAMINE 1 MG/3DAYS TD PT72
1.0000 | MEDICATED_PATCH | TRANSDERMAL | Status: DC
  Administered 2021-09-29: 1.5 mg via TRANSDERMAL

## 2021-09-29 MED ORDER — FENTANYL CITRATE PF 50 MCG/ML IJ SOSY
PREFILLED_SYRINGE | INTRAMUSCULAR | Status: AC
  Filled 2021-09-29: qty 3

## 2021-09-29 MED ORDER — DEXAMETHASONE SODIUM PHOSPHATE 10 MG/ML IJ SOLN
INTRAMUSCULAR | Status: AC
  Filled 2021-09-29: qty 1

## 2021-09-29 MED ORDER — CHLORTHALIDONE 25 MG PO TABS
25.0000 mg | ORAL_TABLET | Freq: Every day | ORAL | Status: DC
  Administered 2021-09-29 – 2021-09-30 (×2): 25 mg via ORAL
  Filled 2021-09-29 (×2): qty 1

## 2021-09-29 MED ORDER — PROPOFOL 10 MG/ML IV BOLUS
INTRAVENOUS | Status: AC
  Filled 2021-09-29: qty 20

## 2021-09-29 MED ORDER — ACETAMINOPHEN 500 MG PO TABS
ORAL_TABLET | ORAL | Status: AC
  Administered 2021-09-29: 500 mg
  Filled 2021-09-29: qty 2

## 2021-09-29 MED ORDER — METOPROLOL TARTRATE 5 MG/5ML IV SOLN
5.0000 mg | Freq: Once | INTRAVENOUS | Status: AC
  Administered 2021-09-29: 5 mg via INTRAVENOUS
  Filled 2021-09-29: qty 5

## 2021-09-29 MED ORDER — ROCURONIUM BROMIDE 10 MG/ML (PF) SYRINGE
PREFILLED_SYRINGE | INTRAVENOUS | Status: DC | PRN
  Administered 2021-09-29: 20 mg via INTRAVENOUS
  Administered 2021-09-29: 60 mg via INTRAVENOUS

## 2021-09-29 MED ORDER — 0.9 % SODIUM CHLORIDE (POUR BTL) OPTIME
TOPICAL | Status: DC | PRN
  Administered 2021-09-29: 1000 mL

## 2021-09-29 MED ORDER — KETAMINE HCL 10 MG/ML IJ SOLN
INTRAMUSCULAR | Status: AC
  Filled 2021-09-29: qty 1

## 2021-09-29 MED ORDER — ONDANSETRON HCL 4 MG/2ML IJ SOLN
INTRAMUSCULAR | Status: AC
  Filled 2021-09-29: qty 2

## 2021-09-29 MED ORDER — ALBUTEROL SULFATE HFA 108 (90 BASE) MCG/ACT IN AERS
INHALATION_SPRAY | RESPIRATORY_TRACT | Status: DC | PRN
  Administered 2021-09-29 (×2): 5 via RESPIRATORY_TRACT

## 2021-09-29 MED ORDER — ACETAMINOPHEN 500 MG PO TABS
1000.0000 mg | ORAL_TABLET | ORAL | Status: AC
  Administered 2021-09-29: 1000 mg via ORAL

## 2021-09-29 MED ORDER — FENTANYL CITRATE (PF) 100 MCG/2ML IJ SOLN
INTRAMUSCULAR | Status: DC | PRN
  Administered 2021-09-29 (×3): 50 ug via INTRAVENOUS

## 2021-09-29 MED ORDER — ONDANSETRON HCL 4 MG/2ML IJ SOLN
INTRAMUSCULAR | Status: DC | PRN
  Administered 2021-09-29: 4 mg via INTRAVENOUS

## 2021-09-29 MED ORDER — LIDOCAINE 2% (20 MG/ML) 5 ML SYRINGE
INTRAMUSCULAR | Status: DC | PRN
  Administered 2021-09-29: 60 mg via INTRAVENOUS

## 2021-09-29 MED ORDER — PROPOFOL 10 MG/ML IV BOLUS
INTRAVENOUS | Status: DC | PRN
  Administered 2021-09-29: 200 mg via INTRAVENOUS

## 2021-09-29 MED ORDER — FENTANYL CITRATE PF 50 MCG/ML IJ SOSY
25.0000 ug | PREFILLED_SYRINGE | INTRAMUSCULAR | Status: DC | PRN
  Administered 2021-09-29 (×3): 50 ug via INTRAVENOUS

## 2021-09-29 MED ORDER — MIDAZOLAM HCL 5 MG/5ML IJ SOLN
INTRAMUSCULAR | Status: DC | PRN
  Administered 2021-09-29: 2 mg via INTRAVENOUS

## 2021-09-29 SURGICAL SUPPLY — 52 items
ADH SKN CLS APL DERMABOND .7 (GAUZE/BANDAGES/DRESSINGS)
APL PRP STRL LF DISP 70% ISPRP (MISCELLANEOUS) ×1
APL SKNCLS STERI-STRIP NONHPOA (GAUZE/BANDAGES/DRESSINGS)
APPLIER CLIP 5 13 M/L LIGAMAX5 (MISCELLANEOUS) ×1
APPLIER CLIP ROT 10 11.4 M/L (STAPLE)
APR CLP MED LRG 11.4X10 (STAPLE)
APR CLP MED LRG 5 ANG JAW (MISCELLANEOUS) ×1
BAG COUNTER SPONGE SURGICOUNT (BAG) IMPLANT
BAG SPEC RTRVL 10 TROC 200 (ENDOMECHANICALS) ×1
BAG SPNG CNTER NS LX DISP (BAG)
BENZOIN TINCTURE PRP APPL 2/3 (GAUZE/BANDAGES/DRESSINGS) IMPLANT
CABLE HIGH FREQUENCY MONO STRZ (ELECTRODE) ×1 IMPLANT
CHLORAPREP W/TINT 26 (MISCELLANEOUS) ×1 IMPLANT
CLIP APPLIE 5 13 M/L LIGAMAX5 (MISCELLANEOUS) ×1 IMPLANT
CLIP APPLIE ROT 10 11.4 M/L (STAPLE) IMPLANT
CLSR STERI-STRIP ANTIMIC 1/2X4 (GAUZE/BANDAGES/DRESSINGS) IMPLANT
COVER MAYO STAND XLG (MISCELLANEOUS) ×1 IMPLANT
COVER SURGICAL LIGHT HANDLE (MISCELLANEOUS) ×1 IMPLANT
DERMABOND ADVANCED .7 DNX12 (GAUZE/BANDAGES/DRESSINGS) IMPLANT
DRAPE C-ARM 42X120 X-RAY (DRAPES) IMPLANT
ELECT REM PT RETURN 15FT ADLT (MISCELLANEOUS) ×1 IMPLANT
ENDOLOOP SUT PDS II  0 18 (SUTURE) ×2
ENDOLOOP SUT PDS II 0 18 (SUTURE) IMPLANT
GLOVE BIO SURGEON STRL SZ7 (GLOVE) ×1 IMPLANT
GLOVE BIOGEL PI IND STRL 7.5 (GLOVE) ×1 IMPLANT
GOWN STRL REUS W/ TWL LRG LVL3 (GOWN DISPOSABLE) ×1 IMPLANT
GOWN STRL REUS W/TWL LRG LVL3 (GOWN DISPOSABLE) ×1
GRASPER SUT TROCAR 14GX15 (MISCELLANEOUS) IMPLANT
HEMOSTAT SNOW SURGICEL 2X4 (HEMOSTASIS) IMPLANT
IRRIG SUCT STRYKERFLOW 2 WTIP (MISCELLANEOUS) ×1
IRRIGATION SUCT STRKRFLW 2 WTP (MISCELLANEOUS) ×1 IMPLANT
KIT BASIN OR (CUSTOM PROCEDURE TRAY) ×1 IMPLANT
KIT TURNOVER KIT A (KITS) IMPLANT
PENCIL SMOKE EVACUATOR (MISCELLANEOUS) IMPLANT
POUCH RETRIEVAL ECOSAC 10 (ENDOMECHANICALS) ×1 IMPLANT
POUCH RETRIEVAL ECOSAC 10MM (ENDOMECHANICALS) ×1
PROTECTOR NERVE ULNAR (MISCELLANEOUS) IMPLANT
SCISSORS LAP 5X35 DISP (ENDOMECHANICALS) ×1 IMPLANT
SET CHOLANGIOGRAPH MIX (MISCELLANEOUS) IMPLANT
SET TUBE SMOKE EVAC HIGH FLOW (TUBING) ×1 IMPLANT
SLEEVE Z-THREAD 5X100MM (TROCAR) ×2 IMPLANT
SPIKE FLUID TRANSFER (MISCELLANEOUS) ×1 IMPLANT
STRIP CLOSURE SKIN 1/2X4 (GAUZE/BANDAGES/DRESSINGS) IMPLANT
SUT MNCRL AB 4-0 PS2 18 (SUTURE) ×1 IMPLANT
SUT VICRYL 0 TIES 12 18 (SUTURE) IMPLANT
SUT VICRYL 0 UR6 27IN ABS (SUTURE) IMPLANT
TOWEL OR 17X26 10 PK STRL BLUE (TOWEL DISPOSABLE) ×1 IMPLANT
TOWEL OR NON WOVEN STRL DISP B (DISPOSABLE) ×1 IMPLANT
TRAY LAPAROSCOPIC (CUSTOM PROCEDURE TRAY) ×1 IMPLANT
TROCAR 11X100 Z THREAD (TROCAR) IMPLANT
TROCAR BALLN 12MMX100 BLUNT (TROCAR) ×1 IMPLANT
TROCAR Z-THREAD OPTICAL 5X100M (TROCAR) ×1 IMPLANT

## 2021-09-29 NOTE — Progress Notes (Signed)
Patient's BP trending high for the evening.   Messaged On-call Ccs MD Stark Klein, MD) and given verbal order for once dose of Metoprolol IV 5mg  and PO 25mg  HCTZ once daily.   Patient already has Chlorthalidone (Hygroton) 25mg  scheduled daily for BP, computer indicated duplicate medication', therefore the [place] was not placed on MAR. Also, when discussing with patient she said "I don't want to take any new pills until I talk to my doctor."   Metoprolol given. Will continue to monitor.

## 2021-09-29 NOTE — Op Note (Signed)
Preoperative diagnosis: Acute cholecystitis Postoperative diagnosis: Same as above Procedure: Laparoscopic cholecystectomy Surgeon: Dr. Serita Grammes Anesthesia: General Complications: None Drains: None Estimated blood loss: Minimal Specimens: Gallbladder and contents to pathology Sponge needle count was correct at completion Disposition to recovery stable condition  Indications: This is a otherwise fairly healthy 36 year old female with 48 hours of right upper quadrant pain radiated to her back.  She was noted to have on ultrasound gallstones and likely cholecystitis.  She had a Murphys sign on her exam and She when I saw her.  She had normal liver function tests had normal liver function test.  I discussed going to the operating room after starting antibiotics for laparoscopic cholecystectomy.  Procedure: After Informed consent was obtained the patient was taken to the operating room.  She was already on antibiotics.  SCDs were in place.  She was placed under general anesthesia without complication.  She was then prepped and draped in the standard sterile surgical fashion.  A surgical timeout was then performed.  I infiltrated Marcaine below the umbilicus.  I made a vertical incision.  I grasped the fascia and incised it sharply.  I entered the peritoneum bluntly with a Kelly clamp.  There was no evidence of an injury.  I then placed a 0 Vicryl pursestring suture through the fascia.  I inserted a Hassan trocar and insufflated the abdomen to 15 mmHg pressure.  I then inserted 3 additional 5 mm trocars in the epigastrium and right side of the abdomen under direct vision without complication.  Her gallbladder was noted to have acute cholecystitis.  It was difficult to grasp as it was tense.  I did have to aspirate the gallbladder.  I then retracted it cephalad and lateral.  She her wall was very thick making this somewhat difficult.  I was able to dissect in the triangle and clearly obtain the  critical view of safety.  I then clipped the artery 3 times and divided it leaving 2 clips in place.  There was some oozing from the lymph node that I cauterized.  The gallbladder then had a lot of inflammation near the common duct.  I decided to take the gallbladder dome down and free it from its attachments to the liver.  Once I done this I was left with the cystic duct and the gallbladder free.  I then placed 2 Endoloops on the cystic duct and tied these down.  I was worried that it this area was thick and the clips would not prevent a leak.  I then divided the gallbladder and placed it in a retrieval bag.  I removed it from the abdomen.  I did place a small piece of Surgicel snow overlying the lymph node otherwise this was hemostatic upon completion.  When I remove the gallbladder from the abdomen I actually had to enlarge my fascial incision due to the the fact that the stones were so large.  The skin incision was also enlarged just to get the gallbladder out.  Once I had done this I tied my pursestring down.  I then placed 2 additional 0 Vicryl sutures to completely obliterate this larger umbilical defect.  This was completely closed at the completion of the operation.  I then desufflated the abdomen and remove the remaining trocars.  These was closed with 4-0 Monocryl glue and Steri-Strips.  She tolerated this well was extubated and transferred to recovery stable.

## 2021-09-29 NOTE — Anesthesia Preprocedure Evaluation (Addendum)
Anesthesia Evaluation  Patient identified by MRN, date of birth, ID band Patient awake    Reviewed: Allergy & Precautions, NPO status , Patient's Chart, lab work & pertinent test results  History of Anesthesia Complications Negative for: history of anesthetic complications  Airway Mallampati: III  TM Distance: >3 FB Neck ROM: Full    Dental  (+) Dental Advisory Given   Pulmonary asthma , former smoker,    Pulmonary exam normal        Cardiovascular hypertension, Pt. on medications Normal cardiovascular exam     Neuro/Psych negative neurological ROS  negative psych ROS   GI/Hepatic negative GI ROS, Neg liver ROS,   Endo/Other   Obesity   Renal/GU negative Renal ROS     Musculoskeletal negative musculoskeletal ROS (+)   Abdominal (+) + obese,   Peds  Hematology negative hematology ROS (+)   Anesthesia Other Findings   Reproductive/Obstetrics                           Anesthesia Physical Anesthesia Plan  ASA: 2  Anesthesia Plan: General   Post-op Pain Management: Tylenol PO (pre-op)* and Celebrex PO (pre-op)*   Induction: Intravenous  PONV Risk Score and Plan: 3 and Treatment may vary due to age or medical condition, Ondansetron, Dexamethasone, Midazolam and Scopolamine patch - Pre-op  Airway Management Planned: Oral ETT  Additional Equipment: None  Intra-op Plan:   Post-operative Plan: Extubation in OR  Informed Consent: I have reviewed the patients History and Physical, chart, labs and discussed the procedure including the risks, benefits and alternatives for the proposed anesthesia with the patient or authorized representative who has indicated his/her understanding and acceptance.     Dental advisory given  Plan Discussed with: CRNA and Anesthesiologist  Anesthesia Plan Comments:        Anesthesia Quick Evaluation

## 2021-09-29 NOTE — Discharge Instructions (Signed)
[facility] LAPAROSCOPIC SURGERY: POST OP INSTRUCTIONS  Always review your discharge instruction sheet given to you by the facility where your surgery was performed. IF YOU HAVE DISABILITY OR FAMILY LEAVE FORMS, YOU MUST BRING THEM TO THE OFFICE FOR PROCESSING.   DO NOT GIVE THEM TO YOUR DOCTOR.  A prescription for pain medication may be given to you upon discharge.  Take your pain medication as prescribed, if needed.  If narcotic pain medicine is not needed, then you may take acetaminophen (Tylenol), naprosyn (Alleve), or ibuprofen (Advil) as needed. Take your usually prescribed medications unless otherwise directed. If you need a refill on your pain medication, please contact your pharmacy.  They will contact our office to request authorization. Prescriptions will not be filled after 5pm or on week-ends. You should follow a light diet the first few days after arrival home, such as soup and crackers, etc.  Be sure to include lots of fluids daily. Most patients will experience some swelling and bruising in the area of the incisions.  Ice packs will help.  Swelling and bruising can take several days to resolve.  It is common to experience some constipation if taking pain medication after surgery.  Increasing fluid intake and taking a stool softener (such as Colace) will usually help or prevent this problem from occurring.  A mild laxative (Milk of Magnesia or Miralax) should be taken according to package instructions if there are no bowel movements after 48 hours. Unless discharge instructions indicate otherwise, you may remove your bandages 48 hours after surgery, and you may shower at that time.  You may have steri-strips (small skin tapes) in place directly over the incision.  These strips should be left on the skin for 7-10 days.  If your surgeon used skin glue on the incision, you may shower in 24 hours.  The glue will flake off over the next  2-3 weeks.  Any sutures or staples will be removed at the office during your follow-up visit. ACTIVITIES:  You may resume regular (light) daily activities beginning the next day--such as daily self-care, walking, climbing stairs--gradually increasing activities as tolerated.  You may have sexual intercourse when it is comfortable.  Refrain from any heavy lifting or straining until approved by your doctor. You may drive when you are no longer taking prescription pain medication, you can comfortably wear a seatbelt, and you can safely maneuver your car and apply brakes. RETURN TO WORK:  __________________________________________________________ You should see your doctor in the office for a follow-up appointment approximately 2-3 weeks after your surgery.  Make sure that you call for this appointment within a day or two after you arrive home to insure a convenient appointment time. OTHER INSTRUCTIONS: __________________________________________________________________________________________________________________________ __________________________________________________________________________________________________________________________ WHEN TO CALL YOUR DOCTOR: Fever over 101.0 Inability to urinate Continued bleeding from incision. Increased pain, redness, or drainage from the incision. Increasing abdominal pain  The clinic staff is available to answer your questions during regular business hours.  Please don't hesitate to call and ask to speak to one of the nurses for clinical concerns.  If you have a medical emergency, go to the nearest emergency room or call 911.  A surgeon from [facility] is always on call at the hospital. [address], [address], [place], [place]  [address] ? [address] [address], [place], [place]   [address] [phone] ? [phone] ? FAX [phone] Web site: [email or web address]  

## 2021-09-29 NOTE — Anesthesia Postprocedure Evaluation (Signed)
Anesthesia Post Note  Patient: Debra Strong  Procedure(s) Performed: LAPAROSCOPIC CHOLECYSTECTOMY (Abdomen)     Patient location during evaluation: PACU Anesthesia Type: General Level of consciousness: awake and alert Pain management: pain level controlled Vital Signs Assessment: post-procedure vital signs reviewed and stable Respiratory status: spontaneous breathing, nonlabored ventilation and respiratory function stable Cardiovascular status: stable and blood pressure returned to baseline Anesthetic complications: no   No notable events documented.  Last Vitals:  Vitals:   09/29/21 1015 09/29/21 1030  BP: 122/85 (!) 154/85  Pulse: 72 84  Resp: 18 18  Temp: (!) 36.1 C   SpO2: 98% 99%    Last Pain:  Vitals:   09/29/21 1030  TempSrc:   PainSc: [place]

## 2021-09-29 NOTE — Interval H&P Note (Signed)
History and Physical Interval Note:  09/29/2021 7:46 AM  [place]  has presented today for surgery, with the diagnosis of CHOLELITTHIASIS.  The various methods of treatment have been discussed with the patient and family. After consideration of risks, benefits and other options for treatment, the patient has consented to  Procedure(s): LAPAROSCOPIC CHOLECYSTECTOMY WITH INTRAOPERATIVE CHOLANGIOGRAM (N/A) as a surgical intervention.  The patient's history has been reviewed, patient examined, no change in status, stable for surgery.  I have reviewed the patient's chart and labs.  Questions were answered to the patient's satisfaction.     Rolm Bookbinder

## 2021-09-29 NOTE — Transfer of Care (Signed)
Immediate Anesthesia Transfer of Care Note  Patient: Debra Strong  Procedure(s) Performed: LAPAROSCOPIC CHOLECYSTECTOMY (Abdomen)  Patient Location: PACU  Anesthesia Type:General  Level of Consciousness: awake and patient cooperative  Airway & Oxygen Therapy: Patient Spontanous Breathing and Patient connected to face mask  Post-op Assessment: Report given to RN and Post -op Vital signs reviewed and stable  Post vital signs: Reviewed and stable  Last Vitals:  Vitals Value Taken Time  BP 122/70 09/29/21 0939  Temp    Pulse 92 09/29/21 0940  Resp    SpO2 100 % 09/29/21 0940  Vitals shown include unvalidated device data.  Last Pain:  Vitals:   09/29/21 0524  TempSrc: Oral  PainSc:          Complications: No notable events documented.

## 2021-09-29 NOTE — Anesthesia Procedure Notes (Signed)
Procedure Name: Intubation Date/Time: 09/29/2021 8:00 AM  Performed by: Claudia Desanctis, CRNAPre-anesthesia Checklist: Patient identified, Emergency Drugs available, Suction available and Patient being monitored Patient Re-evaluated:Patient Re-evaluated prior to induction Oxygen Delivery Method: Circle system utilized Preoxygenation: Pre-oxygenation with 100% oxygen Induction Type: IV induction Ventilation: Mask ventilation without difficulty Laryngoscope Size: Miller and 3 Grade View: Grade II Tube type: Oral Tube size: 7.5 mm Number of attempts: 1 Airway Equipment and Method: Stylet Placement Confirmation: ETT inserted through vocal cords under direct vision, positive ETCO2 and breath sounds checked- equal and bilateral Secured at: 22 cm Tube secured with: Tape Dental Injury: Teeth and Oropharynx as per pre-operative assessment  Comments: Anterior view

## 2021-09-30 ENCOUNTER — Encounter (HOSPITAL_COMMUNITY): Payer: Self-pay | Admitting: General Surgery

## 2021-09-30 NOTE — Progress Notes (Signed)
Reviewed written d/c instructions w pt and all questions answered. She verbalized understanding. D/C via w/c w all belongings in stable condition. 
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
## 2021-10-02 LAB — SURGICAL PATHOLOGY

## 2022-04-11 ENCOUNTER — Encounter (HOSPITAL_COMMUNITY): Payer: Self-pay | Admitting: Pharmacy Technician

## 2022-04-11 ENCOUNTER — Emergency Department (HOSPITAL_COMMUNITY): Payer: Medicaid Other

## 2022-04-11 ENCOUNTER — Other Ambulatory Visit: Payer: Self-pay

## 2022-04-11 ENCOUNTER — Emergency Department (HOSPITAL_COMMUNITY)
Admission: EM | Admit: 2022-04-11 | Discharge: 2022-04-11 | Disposition: A | Discharge status: Home / Self Care | Payer: Medicaid Other | Attending: Emergency Medicine | Admitting: Emergency Medicine

## 2022-04-11 DIAGNOSIS — Z91040 Latex allergy status: Secondary | ICD-10-CM | POA: Insufficient documentation

## 2022-04-11 DIAGNOSIS — S161XXA Strain of muscle, fascia and tendon at neck level, initial encounter: Secondary | ICD-10-CM | POA: Diagnosis not present

## 2022-04-11 DIAGNOSIS — S199XXA Unspecified injury of neck, initial encounter: Secondary | ICD-10-CM | POA: Diagnosis present

## 2022-04-11 DIAGNOSIS — Y92410 Unspecified street and highway as the place of occurrence of the external cause: Secondary | ICD-10-CM | POA: Insufficient documentation

## 2022-04-11 DIAGNOSIS — G44319 Acute post-traumatic headache, not intractable: Secondary | ICD-10-CM

## 2022-04-11 MED ORDER — CYCLOBENZAPRINE HCL 10 MG PO TABS: 10.0000 mg | ORAL_TABLET | Freq: Three times a day (TID) | ORAL | 0 refills | Status: AC | PRN

## 2022-04-11 MED ORDER — IBUPROFEN 600 MG PO TABS: 600.0000 mg | ORAL_TABLET | Freq: Three times a day (TID) | ORAL | 0 refills | Status: AC | PRN

## 2022-04-11 MED ORDER — OXYCODONE-ACETAMINOPHEN 5-325 MG PO TABS
2.0000 | ORAL_TABLET | Freq: Once | ORAL | Status: AC
  Administered 2022-04-11: 2 via ORAL
  Filled 2022-04-11: qty 2

## 2022-04-11 MED ORDER — ONDANSETRON 4 MG PO TBDP
4.0000 mg | ORAL_TABLET | Freq: Once | ORAL | Status: AC
  Administered 2022-04-11: 4 mg via ORAL
  Filled 2022-04-11: qty 1

## 2022-04-11 MED ORDER — KETOROLAC TROMETHAMINE 15 MG/ML IJ SOLN
15.0000 mg | Freq: Once | INTRAMUSCULAR | Status: AC
  Administered 2022-04-11: 15 mg via INTRAMUSCULAR
  Filled 2022-04-11: qty 1

## 2022-04-11 NOTE — ED Notes (Signed)
Pt A&OX4 ambulatory at d/c with independent steady gait. Pt verbalized understanding of d/c instructions, prescriptions and follow up care. 
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
## 2022-04-11 NOTE — ED Notes (Signed)
The patient reports she is not pregnant and states she will sign a waiver for CT. CT informed.  PA at bedside.
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
## 2022-04-11 NOTE — ED Notes (Signed)
Patient transported to CT 
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
## 2022-04-16 ENCOUNTER — Encounter: Payer: Self-pay | Admitting: Family Medicine

## 2022-04-16 ENCOUNTER — Ambulatory Visit (INDEPENDENT_AMBULATORY_CARE_PROVIDER_SITE_OTHER): Payer: Medicaid Other | Admitting: Sports Medicine

## 2022-04-16 ENCOUNTER — Ambulatory Visit (INDEPENDENT_AMBULATORY_CARE_PROVIDER_SITE_OTHER): Payer: Medicaid Other | Admitting: Family Medicine

## 2022-04-16 VITALS — HR 63 | Ht 62.0 in | Wt 212.0 lb

## 2022-04-16 VITALS — BP 126/82 | HR 93 | Ht 62.0 in | Wt 212.2 lb

## 2022-04-16 DIAGNOSIS — F339 Major depressive disorder, recurrent, unspecified: Secondary | ICD-10-CM

## 2022-04-16 DIAGNOSIS — F329 Major depressive disorder, single episode, unspecified: Secondary | ICD-10-CM | POA: Insufficient documentation

## 2022-04-16 DIAGNOSIS — M542 Cervicalgia: Secondary | ICD-10-CM

## 2022-04-16 DIAGNOSIS — R42 Dizziness and giddiness: Secondary | ICD-10-CM

## 2022-04-16 DIAGNOSIS — R519 Headache, unspecified: Secondary | ICD-10-CM | POA: Insufficient documentation

## 2022-04-16 DIAGNOSIS — R110 Nausea: Secondary | ICD-10-CM | POA: Diagnosis not present

## 2022-04-16 DIAGNOSIS — R270 Ataxia, unspecified: Secondary | ICD-10-CM

## 2022-04-16 DIAGNOSIS — S060X0A Concussion without loss of consciousness, initial encounter: Secondary | ICD-10-CM | POA: Diagnosis not present

## 2022-04-16 DIAGNOSIS — G4700 Insomnia, unspecified: Secondary | ICD-10-CM | POA: Diagnosis not present

## 2022-04-16 DIAGNOSIS — I10 Essential (primary) hypertension: Secondary | ICD-10-CM

## 2022-04-16 DIAGNOSIS — M546 Pain in thoracic spine: Secondary | ICD-10-CM

## 2022-04-16 MED ORDER — ONDANSETRON 4 MG PO TBDP
4.0000 mg | ORAL_TABLET | Freq: Once | ORAL | Status: AC
  Administered 2022-04-16: 4 mg via ORAL

## 2022-04-16 MED ORDER — DULOXETINE HCL 30 MG PO CPEP: 30.0000 mg | ORAL_CAPSULE | Freq: Every day | ORAL | 2 refills | Status: DC

## 2022-04-16 NOTE — Patient Instructions (Signed)
Concussion, Adult  A concussion is a brain injury from a hard, direct hit (trauma) to your head or body. This hit causes your brain to quickly shake back and forth inside your skull. A concussion may also be called a mild traumatic brain injury (TBI). Healing from this injury can take time. The effects of a concussion can be serious. If you have a concussion, you should be very careful to avoid having a second concussion. What are the causes? This condition is caused by: A direct hit to your head. A quick and sudden movement of the head or neck, such as in a car crash. What are the signs or symptoms? The signs of a concussion can be hard to notice. They may be missed by you, family members, and doctors. You may look fine on the outside but may not act or feel normal. Physical symptoms Headaches or feeling dizzy. Problems with body balance. Being sensitive to light or noise. Vomiting or feeling like you may vomit. Being tired. Problems seeing or hearing. Seizure. Mental and emotional symptoms Feeling grouchy (irritable) or having mood changes. Problems remembering things. Trouble focusing your mind (concentrating), organizing, or making decisions. Not sleeping or eating as you used to. Being slow to think, act, react, speak, or read. Feeling worried or nervous (anxious). Feeling sad (depressed). How is this treated? This condition may be treated by: Stopping sports or activity if you are injured. Resting your body and your mind. Being watched carefully, often at home. Medicines to help with symptoms such as: Headaches. Feeling like you may vomit. Problems with sleep. You may need to go to a concussion clinic or a place to help you recover (rehab). Follow these instructions at home: Activity Limit activities that need a lot of thought or focus, such as: Homework or work for your job. Watching TV. Using the computer or phone. Playing memory games and puzzles. Get rest because  this helps your brain heal. Make sure you: Get plenty of sleep. Most adults should get 7-9 hours of sleep each night. Rest during the day. Take naps or breaks when you feel tired. Avoid activity or exercise that takes a lot of effort until your doctor says it is safe. Stop any activity that makes symptoms worse. Your doctor may tell you to do light exercise like walking. Do not do activities that could cause a second concussion, such as riding a bike or playing sports. Ask your doctor when you can return to your normal activities, such as school, work, sports, and driving. Your ability to react may be slower. Do not do these activities if you are dizzy. General instructions  Take over-the-counter and prescription medicines only as told by your doctor. Avoid taking strong pain medicines (opioids) after a concussion. Do not drink alcohol until your doctor says you can. Watch your symptoms and tell other people to do the same. Other problems can occur after a concussion. Tell your work manager, teachers, school nurse, school counselor, coach, or athletic trainer about your injury and symptoms. Tell them about what you can or cannot do. See a mental health therapist if you keep feeling worried and nervous or sad. Keep all follow-up visits. Your doctor will check on your recovery and give you a plan for returning to activities. How is this prevented? It is very important that you do not get another brain injury. In rare cases, another injury can cause brain damage that will not go away, brain swelling, or death. The risk of this   is greatest in the first 7-10 days after a head injury. To avoid injuries: Stop activities that could lead to a second concussion, such as contact sports, until your doctor says it is okay. When you return to sports or activities: Do not crash into other players. This is how most concussions happen. Follow the rules. Respect other players. Do not engage in violent  behavior while playing. Get regular exercise. Do strength and balance training. Wear a helmet that fits you well during sports, biking, or other activities. Helmets can help protect you from serious skull and brain injuries, but they may not protect you from a concussion. Even when wearing a helmet, you should avoid being hit in the head. Where to find more information Centers for Disease Control and Prevention: cdc.gov Contact a doctor if: Your symptoms do not get better or get worse. You have new symptoms. You have another injury. Your balance gets worse. You have changes in how you act. Get help right away if: You have very bad headaches or your headaches get worse. You have any of these problems: Feeling weak or numb in any part of your body. Slurred speech. Changes in how you see (vision). Feeling mixed up (confused). You vomit often. You faint or other people have trouble waking you up. You have a seizure. These symptoms may be an emergency. Get help right away. Call 911. Do not wait to see if the symptoms will go away. Do not drive yourself to the hospital. Also, get help right away if: You have thoughts of hurting yourself or others. Take one of these steps if you feel like you may hurt yourself or others, or have thoughts about taking your own life: Go to your nearest emergency room. Call 911. Call the National Suicide Prevention Lifeline at 1-800-273-8255 or 988. This is open 24 hours a day. Text the Crisis Text Line at 741741. This information is not intended to replace advice given to you by your health care provider. Make sure you discuss any questions you have with your health care provider. Document Revised: 05/18/2021 Document Reviewed: 05/18/2021 Elsevier Patient Education  2023 [employer]  
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
## 2022-04-16 NOTE — Assessment & Plan Note (Signed)
BP is at goal. 

## 2022-04-16 NOTE — Assessment & Plan Note (Signed)
Worsened. No SI or HI. Declined Psych/psychology referral. D/C Zoloft. I started her on Cymbalta which should also help give pain relieve. F/U in 4 weeks for reassessment.

## 2022-04-16 NOTE — Patient Instructions (Addendum)
Good to see you  Work note out of work for 1 week  -           Relative mental and physical rest for 48 hours after concussive event -           Recommend light aerobic activity while keeping symptoms less than 3/10 -           Stop mental or physical activities that cause symptoms to worsen greater than 3/10, and wait 24 hours before attempting them again -           Eliminate screen time as much as possible for first 48 hours after concussive event, then continue limited screen time (recommend less than 2 hours per day)   May continue flexeril every 8 hours as needed 10 mg  May continue to alternate between ibuprofen 600 and tylenol 500 mg  Start melatonin 5 mg nightly for goal of 7-8 hours of sleep No driving  Neck HEP  1 week follow up
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
## 2022-04-17 ENCOUNTER — Telehealth: Payer: Self-pay | Admitting: Family Medicine

## 2022-04-17 LAB — CMP14+EGFR
ALT: 28 IU/L (ref 0–32)
AST: 14 IU/L (ref 0–40)
Albumin/Globulin Ratio: 1.5 (ref 1.2–2.2)
Albumin: 4.2 g/dL (ref 3.9–4.9)
Alkaline Phosphatase: 76 IU/L (ref 44–121)
BUN/Creatinine Ratio: 13 (ref 9–23)
BUN: 11 mg/dL (ref 6–20)
Bilirubin Total: 0.3 mg/dL (ref 0.0–1.2)
CO2: 26 mmol/L (ref 20–29)
Calcium: 9.4 mg/dL (ref 8.7–10.2)
Chloride: 101 mmol/L (ref 96–106)
Creatinine, Ser: 0.88 mg/dL (ref 0.57–1.00)
Globulin, Total: 2.8 g/dL (ref 1.5–4.5)
Glucose: 94 mg/dL (ref 70–99)
Potassium: 4.1 mmol/L (ref 3.5–5.2)
Sodium: 140 mmol/L (ref 134–144)
Total Protein: 7 g/dL (ref 6.0–8.5)
eGFR: 87 mL/min/{1.73_m2} (ref >59)

## 2022-04-17 LAB — CBC WITH DIFFERENTIAL/PLATELET
Basophils Absolute: 0 10*3/uL (ref 0.0–0.2)
Basos: 1 %
EOS (ABSOLUTE): 0.3 10*3/uL (ref 0.0–0.4)
Eos: 5 %
Hematocrit: 39.6 % (ref 34.0–46.6)
Hemoglobin: 13.7 g/dL (ref 11.1–15.9)
Immature Grans (Abs): 0 10*3/uL (ref 0.0–0.1)
Immature Granulocytes: 0 %
Lymphocytes Absolute: 1.7 10*3/uL (ref 0.7–3.1)
Lymphs: 29 %
MCH: 30.2 pg (ref 26.6–33.0)
MCHC: 34.6 g/dL (ref 31.5–35.7)
MCV: 87 fL (ref 79–97)
Monocytes Absolute: 0.5 10*3/uL (ref 0.1–0.9)
Monocytes: 9 %
Neutrophils Absolute: 3.3 10*3/uL (ref 1.4–7.0)
Neutrophils: 56 %
Platelets: 297 10*3/uL (ref 150–450)
RBC: 4.54 x10E6/uL (ref 3.77–5.28)
RDW: 14.1 % (ref 11.7–15.4)
WBC: 5.8 10*3/uL (ref 3.4–10.8)

## 2022-04-17 NOTE — Telephone Encounter (Signed)
Patient returns call to nurse line. Advised of provider message.   No questions at this time.   Veronda Prude, RN

## 2022-04-17 NOTE — Telephone Encounter (Signed)
HIPAA compliant callback message left.  Please let her know that all her test results were normal. No electrolyte abnormalities and her hemoglobin is normal.
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
## 2022-04-23 ENCOUNTER — Ambulatory Visit (INDEPENDENT_AMBULATORY_CARE_PROVIDER_SITE_OTHER): Payer: Medicaid Other | Admitting: Sports Medicine

## 2022-04-23 VITALS — BP 132/82 | HR 96 | Ht 62.0 in | Wt 212.0 lb

## 2022-04-23 DIAGNOSIS — S060X0A Concussion without loss of consciousness, initial encounter: Secondary | ICD-10-CM | POA: Diagnosis not present

## 2022-04-23 DIAGNOSIS — M542 Cervicalgia: Secondary | ICD-10-CM | POA: Diagnosis not present

## 2022-04-23 DIAGNOSIS — R42 Dizziness and giddiness: Secondary | ICD-10-CM

## 2022-04-23 DIAGNOSIS — G4700 Insomnia, unspecified: Secondary | ICD-10-CM

## 2022-04-23 DIAGNOSIS — R270 Ataxia, unspecified: Secondary | ICD-10-CM

## 2022-04-23 NOTE — Patient Instructions (Addendum)
Good to see you Work note out of work for 1 week May start Cymbalta  -           Relative mental and physical rest for 48 hours after concussive event -           Recommend light aerobic activity while keeping symptoms less than 3/10 -           Stop mental or physical activities that cause symptoms to worsen greater than 3/10, and wait 24 hours before attempting them again -           Eliminate screen time as much as possible for first 48 hours after concussive event, then continue limited screen time (recommend less than 2 hours per day) 1 week follow up

## 2022-04-24 ENCOUNTER — Telehealth: Payer: Self-pay

## 2022-04-24 MED ORDER — CHLORTHALIDONE 25 MG PO TABS: 25.0000 mg | ORAL_TABLET | Freq: Every day | ORAL | 1 refills | Status: DC

## 2022-04-24 MED ORDER — DULOXETINE HCL 30 MG PO CPEP: 30.0000 mg | ORAL_CAPSULE | Freq: Every day | ORAL | 2 refills | Status: DC
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
## 2022-04-24 NOTE — Telephone Encounter (Signed)
Received call from [employer] requesting to speak with provider regarding patient's disability.   They have additional questions regarding patient's claim.   They are requesting returned call at [phone].  Veronda Prude, RN

## 2022-04-26 ENCOUNTER — Encounter: Payer: Self-pay | Admitting: Family Medicine

## 2022-04-26 NOTE — Progress Notes (Signed)
Disability form from [facility] insurance completed and placed in the fax box. Copy made for file. Disability began on 04/11/22 till 04/29/21 Need reevaluation to extend.
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
## 2022-04-30 ENCOUNTER — Ambulatory Visit: Payer: Medicaid Other | Admitting: Student

## 2022-04-30 ENCOUNTER — Ambulatory Visit (INDEPENDENT_AMBULATORY_CARE_PROVIDER_SITE_OTHER): Payer: Medicaid Other | Admitting: Sports Medicine

## 2022-04-30 VITALS — BP 130/82 | HR 107 | Ht 62.0 in | Wt 211.0 lb

## 2022-04-30 DIAGNOSIS — S060X0A Concussion without loss of consciousness, initial encounter: Secondary | ICD-10-CM

## 2022-04-30 DIAGNOSIS — R4189 Other symptoms and signs involving cognitive functions and awareness: Secondary | ICD-10-CM
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
## 2022-04-30 NOTE — Patient Instructions (Addendum)
Good to see you Work note starting 4/24/202 can return to work 4 hours maximum taking rest breaks as needed . Starting May 1 may increase to 6 hours a day maximum , recommend taking a 15 minute break every hour as needed  2 week follow up
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
## 2022-05-14 ENCOUNTER — Encounter: Payer: 59 | Admitting: Sports Medicine
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
## 2022-05-21 ENCOUNTER — Ambulatory Visit: Payer: Medicaid Other | Admitting: Sports Medicine

## 2022-05-21 VITALS — HR 101 | Ht 62.0 in | Wt 214.0 lb

## 2022-05-21 DIAGNOSIS — S060X0A Concussion without loss of consciousness, initial encounter: Secondary | ICD-10-CM | POA: Diagnosis not present

## 2022-05-21 DIAGNOSIS — M542 Cervicalgia: Secondary | ICD-10-CM

## 2022-05-21 NOTE — Patient Instructions (Addendum)
Good to see you  Return to work with out restrictions  Continue neck HEP and tylenol for neck pain  3 week follow up

## 2022-05-31 ENCOUNTER — Other Ambulatory Visit: Payer: Self-pay | Admitting: Family Medicine
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
## 2022-06-11 ENCOUNTER — Ambulatory Visit (INDEPENDENT_AMBULATORY_CARE_PROVIDER_SITE_OTHER): Payer: Medicaid Other | Admitting: Sports Medicine

## 2022-06-11 VITALS — HR 83 | Ht 62.0 in | Wt 218.0 lb

## 2022-06-11 DIAGNOSIS — S060X0A Concussion without loss of consciousness, initial encounter: Secondary | ICD-10-CM | POA: Diagnosis not present

## 2022-06-11 NOTE — Patient Instructions (Signed)
Good to see you  As needed follow up if hip pain doesn't resolve Cleared from concussion YAYYY

## 2022-06-21 ENCOUNTER — Telehealth: Payer: Self-pay

## 2022-06-21 NOTE — Telephone Encounter (Signed)
Patient calls nurse line requesting an accomodation letter for her employer.   She reports since starting Chlorthalidone she has had an increase in bathroom breaks. 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   She reports she needs the letter to state every 2 hour bathroom breaks.   Will forward to PCP.

## 2022-06-24 ENCOUNTER — Encounter: Payer: Self-pay | Admitting: Family Medicine

## 2022-06-24 NOTE — Telephone Encounter (Signed)
Attempted to reach patient. No answer. LVM of note that was left by PCP. Aquilla Solian, CMA

## 2022-08-23 ENCOUNTER — Encounter: Payer: Self-pay | Admitting: Family Medicine

## 2022-08-23 ENCOUNTER — Ambulatory Visit (INDEPENDENT_AMBULATORY_CARE_PROVIDER_SITE_OTHER): Payer: Medicaid Other | Admitting: Family Medicine

## 2022-08-23 DIAGNOSIS — M25551 Pain in right hip: Secondary | ICD-10-CM | POA: Diagnosis not present

## 2022-08-23 DIAGNOSIS — S060X0D Concussion without loss of consciousness, subsequent encounter: Secondary | ICD-10-CM | POA: Diagnosis not present

## 2022-08-23 DIAGNOSIS — S060XAA Concussion with loss of consciousness status unknown, initial encounter: Secondary | ICD-10-CM | POA: Insufficient documentation

## 2022-08-23 DIAGNOSIS — F339 Major depressive disorder, recurrent, unspecified: Secondary | ICD-10-CM
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
## 2022-08-23 NOTE — Patient Instructions (Signed)
Concussion, Adult  A concussion is a brain injury from a hard, direct hit (trauma) to your head or body. This hit causes your brain to quickly shake back and forth inside your skull. A concussion may also be called a mild traumatic brain injury (TBI). Healing from this injury can take time. The effects of a concussion can be serious. If you have a concussion, you should be very careful to avoid having a second concussion. What are the causes? This condition is caused by: A direct hit to your head. A quick and sudden movement of the head or neck, such as in a car crash. What are the signs or symptoms? The signs of a concussion can be hard to notice. They may be missed by you, family members, and doctors. You may look fine on the outside but may not act or feel normal. Physical symptoms Headaches or feeling dizzy. Problems with body balance. Being sensitive to light or noise. Vomiting or feeling like you may vomit. Being tired. Problems seeing or hearing. Seizure. Mental and emotional symptoms Feeling grouchy (irritable) or having mood changes. Problems remembering things. Trouble focusing your mind (concentrating), organizing, or making decisions. Not sleeping or eating as you used to. Being slow to think, act, react, speak, or read. Feeling worried or nervous (anxious). Feeling sad (depressed). How is this treated? This condition may be treated by: Stopping sports or activity if you are injured. Resting your body and your mind. Being watched carefully, often at home. Medicines to help with symptoms such as: Headaches. Feeling like you may vomit. Problems with sleep. You may need to go to a concussion clinic or a place to help you recover (rehab). Follow these instructions at home: Activity Limit activities that need a lot of thought or focus, such as: Homework or work for your job. Watching TV. Using the computer or phone. Playing memory games and puzzles. Get rest because  this helps your brain heal. Make sure you: Get plenty of sleep. Most adults should get 7-9 hours of sleep each night. Rest during the day. Take naps or breaks when you feel tired. Avoid activity or exercise that takes a lot of effort until your doctor says it is safe. Stop any activity that makes symptoms worse. Your doctor may tell you to do light exercise like walking. Do not do activities that could cause a second concussion, such as riding a bike or playing sports. Ask your doctor when you can return to your normal activities, such as school, work, sports, and driving. Your ability to react may be slower. Do not do these activities if you are dizzy. General instructions  Take over-the-counter and prescription medicines only as told by your doctor. Avoid taking strong pain medicines (opioids) after a concussion. Do not drink alcohol until your doctor says you can. Watch your symptoms and tell other people to do the same. Other problems can occur after a concussion. Tell your work manager, teachers, school nurse, school counselor, coach, or athletic trainer about your injury and symptoms. Tell them about what you can or cannot do. See a mental health therapist if you keep feeling worried and nervous or sad. Keep all follow-up visits. Your doctor will check on your recovery and give you a plan for returning to activities. How is this prevented? It is very important that you do not get another brain injury. In rare cases, another injury can cause brain damage that will not go away, brain swelling, or death. The risk of this   is greatest in the first 7-10 days after a head injury. To avoid injuries: Stop activities that could lead to a second concussion, such as contact sports, until your doctor says it is okay. When you return to sports or activities: Do not crash into other players. This is how most concussions happen. Follow the rules. Respect other players. Do not engage in violent  behavior while playing. Get regular exercise. Do strength and balance training. Wear a helmet that fits you well during sports, biking, or other activities. Helmets can help protect you from serious skull and brain injuries, but they may not protect you from a concussion. Even when wearing a helmet, you should avoid being hit in the head. Where to find more information Centers for Disease Control and Prevention: cdc.gov Contact a doctor if: Your symptoms do not get better or get worse. You have new symptoms. You have another injury. Your balance gets worse. You have changes in how you act. Get help right away if: You have very bad headaches or your headaches get worse. You have any of these problems: Feeling weak or numb in any part of your body. Slurred speech. Changes in how you see (vision). Feeling mixed up (confused). You vomit often. You faint or other people have trouble waking you up. You have a seizure. These symptoms may be an emergency. Get help right away. Call 911. Do not wait to see if the symptoms will go away. Do not drive yourself to the hospital. Also, get help right away if: You have thoughts of hurting yourself or others. Take one of these steps if you feel like you may hurt yourself or others, or have thoughts about taking your own life: Go to your nearest emergency room. Call 911. Call the National Suicide Prevention Lifeline at 1-800-273-8255 or 988. This is open 24 hours a day. Text the Crisis Text Line at 741741. This information is not intended to replace advice given to you by your health care provider. Make sure you discuss any questions you have with your health care provider. Document Revised: 05/18/2021 Document Reviewed: 05/18/2021 Elsevier Patient Education  2024 Elsevier Inc.  

## 2022-08-23 NOTE — Assessment & Plan Note (Signed)
Continue Cymbalta for now Reassess at next appointment

## 2022-08-23 NOTE — Progress Notes (Signed)
Attempted to call patient at 9:56 a.m however patient did not answer. I will try again later. Attempted to call patient again at 10:06 am patient did not answer.

## 2022-08-28 ENCOUNTER — Ambulatory Visit: Payer: Medicaid Other | Admitting: Family Medicine

## 2022-09-30 ENCOUNTER — Encounter: Payer: Self-pay | Admitting: Family Medicine

## 2022-09-30 ENCOUNTER — Telehealth: Payer: Self-pay | Admitting: Family Medicine

## 2022-09-30 NOTE — Telephone Encounter (Signed)
Unable to reach patient or leave a message  Please advise if she calls that her appointment on 10/2 has been changed The date remains the same, but the time is now 4:10 pm to allow for program meeting.  Thanks.

## 2022-10-07 ENCOUNTER — Encounter (HOSPITAL_COMMUNITY): Payer: Self-pay

## 2022-10-07 ENCOUNTER — Ambulatory Visit (HOSPITAL_COMMUNITY)
Admission: EM | Admit: 2022-10-07 | Discharge: 2022-10-07 | Disposition: A | Discharge status: Home / Self Care | Payer: BLUE CROSS/BLUE SHIELD | Attending: Family Medicine | Admitting: Family Medicine

## 2022-10-07 DIAGNOSIS — J019 Acute sinusitis, unspecified: Secondary | ICD-10-CM

## 2022-10-07 DIAGNOSIS — J4521 Mild intermittent asthma with (acute) exacerbation: Secondary | ICD-10-CM

## 2022-10-07 MED ORDER — PREDNISONE 20 MG PO TABS: 40.0000 mg | ORAL_TABLET | Freq: Every day | ORAL | 0 refills | Status: AC

## 2022-10-07 MED ORDER — CEFDINIR 300 MG PO CAPS: 600.0000 mg | ORAL_CAPSULE | Freq: Every day | ORAL | 0 refills | Status: AC

## 2022-10-07 MED ORDER — BENZONATATE 100 MG PO CAPS: 100.0000 mg | ORAL_CAPSULE | Freq: Three times a day (TID) | ORAL | 0 refills | Status: AC | PRN

## 2022-10-07 MED ORDER — ALBUTEROL SULFATE HFA 108 (90 BASE) MCG/ACT IN AERS: 2.0000 | INHALATION_SPRAY | RESPIRATORY_TRACT | 0 refills | Status: DC | PRN

## 2022-10-07 NOTE — ED Triage Notes (Signed)
Patient cough of a productive cough with clear sputum, sore throat and fatigue x 1 week. Patient also reports swelling of the neck glands on the left.  Patient states she has taken Coricidin, turmeric, lemon, and ginger teas.

## 2022-10-07 NOTE — ED Provider Notes (Signed)
[facility]    CSN: [id number] Arrival date & time: 10/07/22  1457      History   Chief Complaint Chief Complaint  Patient presents with   Cough   Sore Throat   Fatigue   Lymphadenopathy    HPI Debra Strong is a 37 y.o. female.    Cough Sore Throat  Here for cough and congestion that is been going on for about 2 weeks.  She is also had some sore throat and now feels that her glands are swollen under her chin. No fever or chills now.  She is having some sinus pressure now.  She does have a history of asthma and has had some wheezing and shortness of breath in the last week.  No vomiting or diarrhea.  No allergies to antibiotics.  Last menstrual cycle was September 12   Past Medical History:  Diagnosis Date   Asthma    Hypertension    Pregnancy induced hypertension     Patient Active Problem List   Diagnosis Date Noted   Concussion 08/23/2022   Right hip pain 08/23/2022   Headache 04/16/2022   MDD (major depressive disorder) 04/16/2022   Cholecystitis 09/28/2021   Thyroid mass 06/05/2021   Jaw pain 06/05/2021   Fatigue 06/05/2021   Former tobacco use 07/31/2018   Essential hypertension, benign 09/10/2012   Asthma 08/29/2006    Past Surgical History:  Procedure Laterality Date   CHOLECYSTECTOMY N/A 09/29/2021   Procedure: LAPAROSCOPIC CHOLECYSTECTOMY;  Surgeon: Emelia Loron, MD;  Location: [facility];  Service: General;  Laterality: N/A;   MOUTH SURGERY     NO PAST SURGERIES     WISDOM TOOTH EXTRACTION      OB History     Gravida  4   Para  0   Term      Preterm  0   AB  3   Living  1      SAB  3   IAB  0   Ectopic      Multiple      Live Births               Home Medications    Prior to Admission medications   Medication Sig Start Date End Date Taking? Authorizing Provider  albuterol (VENTOLIN HFA) 108 (90 Base) MCG/ACT inhaler Inhale 2 puffs into the lungs every 4 (four) hours as needed for  wheezing or shortness of breath. 10/07/22  Yes Zenia Resides, MD  benzonatate (TESSALON) 100 MG capsule Take 1 capsule (100 mg total) by mouth 3 (three) times daily as needed for cough. 10/07/22  Yes Zenia Resides, MD  cefdinir (OMNICEF) 300 MG capsule Take 2 capsules (600 mg total) by mouth daily for 7 days. 10/07/22 10/14/22 Yes Kazuki Ingle, Janace Aris, MD  predniSONE (DELTASONE) 20 MG tablet Take 2 tablets (40 mg total) by mouth daily with breakfast for 5 days. 10/07/22 10/12/22 Yes Patirica Longshore, Janace Aris, MD  chlorthalidone (HYGROTON) 25 MG tablet Take 1 tablet (25 mg total) by mouth daily. 04/24/22   Doreene Eland, MD  DULoxetine (CYMBALTA) 30 MG capsule Take 1 capsule (30 mg total) by mouth daily. 04/24/22   Doreene Eland, MD    Family History Family History  Problem Relation Age of Onset   Hypertension Mother    Diabetes Father    Hypertension Father     Social History Social History   Tobacco Use   Smoking status: Former    Types:  Cigarettes   Smokeless tobacco: Never  Vaping Use   Vaping status: Never Used  Substance Use Topics   Alcohol use: No    Alcohol/week: 0.0 standard drinks of alcohol   Drug use: Yes    Types: Marijuana     Allergies   Amlodipine, Lisinopril, Shellfish allergy, and Latex   Review of Systems Review of Systems  Respiratory:  Positive for cough.      Physical Exam Triage Vital Signs ED Triage Vitals  Encounter Vitals Group     BP 10/07/22 1559 127/84     Systolic BP Percentile --      Diastolic BP Percentile --      Pulse Rate 10/07/22 1559 92     Resp 10/07/22 1559 16     Temp 10/07/22 1559 98.3 F (36.8 C)     Temp Source 10/07/22 1559 Oral     SpO2 10/07/22 1559 97 %     Weight --      Height --      Head Circumference --      Peak Flow --      Pain Score 10/07/22 1602 7     Pain Loc --      Pain Education --      Exclude from Growth Chart --    No data found.  Updated Vital Signs BP 127/84 (BP Location: Left Arm)    Pulse 92   Temp 98.3 F (36.8 C) (Oral)   Resp 16   LMP 09/19/2022 (Approximate)   SpO2 97%   Visual Acuity Right Eye Distance:   Left Eye Distance:   Bilateral Distance:    Right Eye Near:   Left Eye Near:    Bilateral Near:     Physical Exam Vitals reviewed.  Constitutional:      General: She is not in acute distress.    Appearance: She is not ill-appearing, toxic-appearing or diaphoretic.  HENT:     Right Ear: Tympanic membrane and ear canal normal.     Left Ear: Tympanic membrane and ear canal normal.     Nose: Congestion present.     Mouth/Throat:     Mouth: Mucous membranes are moist.     Comments: There is some mild erythema and some clear mucus in the posterior oropharynx. Eyes:     Extraocular Movements: Extraocular movements intact.     Conjunctiva/sclera: Conjunctivae normal.     Pupils: Pupils are equal, round, and reactive to light.  Cardiovascular:     Rate and Rhythm: Normal rate and regular rhythm.     Heart sounds: No murmur heard. Pulmonary:     Effort: No respiratory distress.     Breath sounds: No stridor. No rhonchi or rales.     Comments: There is low pitched expiratory wheezes heard scattered throughout the lung fields.  Air movement is good Chest:     Chest wall: No tenderness.  Musculoskeletal:     Cervical back: Neck supple.  Lymphadenopathy:     Cervical: No cervical adenopathy.  Skin:    Capillary Refill: Capillary refill takes less than 2 seconds.     Coloration: Skin is not jaundiced or pale.  Neurological:     General: No focal deficit present.     Mental Status: She is alert and oriented to person, place, and time.  Psychiatric:        Behavior: Behavior normal.      UC Treatments / Results  Labs (all labs ordered are listed,  but only abnormal results are displayed) Labs Reviewed - No data to display  EKG   Radiology No results found.  Procedures Procedures (including critical care time)  Medications Ordered in  UC Medications - No data to display  Initial Impression / Assessment and Plan / UC Course  I have reviewed the triage vital signs and the nursing notes.  Pertinent labs & imaging results that were available during my care of the patient were reviewed by me and considered in my medical decision making (see chart for details).       Albuterol and a burst of prednisone are sent in for asthma exacerbation.  Omnicef is sent in for sinus-itis.  Tessalon Perles are sent in for cough.  Final Clinical Impressions(s) / UC Diagnoses   Final diagnoses:  Mild intermittent asthma with (acute) exacerbation  Acute sinusitis, recurrence not specified, unspecified location     Discharge Instructions      Albuterol inhaler--do 2 puffs every 4 hours as needed for shortness of breath or wheezing  Take prednisone 20 mg--2 daily for 5 days  Take cefdinir 300 mg--2 capsules together daily for 7 days  Take benzonatate 100 mg, 1 tab every 8 hours as needed for cough.       ED Prescriptions     Medication Sig Dispense Auth. Provider   albuterol (VENTOLIN HFA) 108 (90 Base) MCG/ACT inhaler Inhale 2 puffs into the lungs every 4 (four) hours as needed for wheezing or shortness of breath. 1 each Zenia Resides, MD   predniSONE (DELTASONE) 20 MG tablet Take 2 tablets (40 mg total) by mouth daily with breakfast for 5 days. 10 tablet Zenia Resides, MD   cefdinir (OMNICEF) 300 MG capsule Take 2 capsules (600 mg total) by mouth daily for 7 days. 14 capsule Tenlee Wollin, Janace Aris, MD   benzonatate (TESSALON) 100 MG capsule Take 1 capsule (100 mg total) by mouth 3 (three) times daily as needed for cough. 21 capsule Zenia Resides, MD      PDMP not reviewed this encounter.   Zenia Resides, MD 10/07/22 [phone]

## 2022-10-07 NOTE — Discharge Instructions (Signed)
Albuterol inhaler--do 2 puffs every 4 hours as needed for shortness of breath or wheezing  Take prednisone 20 mg--2 daily for 5 days  Take cefdinir 300 mg--2 capsules together daily for 7 days  Take benzonatate 100 mg, 1 tab every 8 hours as needed for cough.

## 2022-10-09 ENCOUNTER — Encounter: Payer: BLUE CROSS/BLUE SHIELD | Admitting: Family Medicine

## 2022-10-14 ENCOUNTER — Encounter: Payer: BLUE CROSS/BLUE SHIELD | Admitting: Family Medicine

## 2022-10-28 ENCOUNTER — Encounter: Payer: Self-pay | Admitting: Family Medicine

## 2022-10-29 ENCOUNTER — Encounter: Payer: Self-pay | Admitting: Family Medicine

## 2022-10-29 ENCOUNTER — Ambulatory Visit: Payer: BLUE CROSS/BLUE SHIELD | Admitting: Family Medicine

## 2022-10-29 VITALS — BP 130/96 | HR 88 | Ht 62.0 in | Wt 212.2 lb

## 2022-10-29 DIAGNOSIS — J4530 Mild persistent asthma, uncomplicated: Secondary | ICD-10-CM | POA: Diagnosis not present

## 2022-10-29 DIAGNOSIS — E079 Disorder of thyroid, unspecified: Secondary | ICD-10-CM

## 2022-10-29 DIAGNOSIS — F339 Major depressive disorder, recurrent, unspecified: Secondary | ICD-10-CM

## 2022-10-29 DIAGNOSIS — F17200 Nicotine dependence, unspecified, uncomplicated: Secondary | ICD-10-CM

## 2022-10-29 DIAGNOSIS — H9319 Tinnitus, unspecified ear: Secondary | ICD-10-CM | POA: Insufficient documentation

## 2022-10-29 DIAGNOSIS — Z0000 Encounter for general adult medical examination without abnormal findings: Secondary | ICD-10-CM | POA: Diagnosis not present

## 2022-10-29 DIAGNOSIS — Z6838 Body mass index (BMI) 38.0-38.9, adult: Secondary | ICD-10-CM

## 2022-10-29 DIAGNOSIS — H9313 Tinnitus, bilateral: Secondary | ICD-10-CM

## 2022-10-29 NOTE — Assessment & Plan Note (Signed)
She lost 4 pounds since her last visit I commended her on job well done She will continue to work on lifestyle modification To consider GLPs in the future
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
## 2022-10-29 NOTE — Assessment & Plan Note (Signed)
Normal hearing exam ?? Related to her recent concussion vs stress at work This started a few days ago and resolved when she sleeps which is reassuring Deferring lab work/neuro-imaging for now due to insurance status and this has only started a few days ago Counseling provided with handout as well F/U soon if symptoms persists or worsens

## 2022-10-29 NOTE — Assessment & Plan Note (Signed)
Did not f/u for thyroid US TSH previously normal Deferring labs for now.

## 2022-10-29 NOTE — Assessment & Plan Note (Signed)
Counseling provided She will work on quitting as she does not smoke heavily

## 2022-10-29 NOTE — Assessment & Plan Note (Signed)
Compliant with Cymbalta Therapy addition recommended but declined for now Monitor for now

## 2022-10-29 NOTE — Assessment & Plan Note (Signed)
Stable Controller medication recommended, but she declined She prefers Albuterol as needed for now Monitor closely

## 2022-10-29 NOTE — Patient Instructions (Signed)
Tinnitus Tinnitus is when you hear a sound that there's no actual source for. It may sound like ringing in your ears or something else. The sound may be loud, soft, or somewhere in between. It can last for a few seconds or be constant for days. It can come and go. Almost everyone has tinnitus at some point. It's not the same as hearing loss. But you may need to see a health care provider if: It lasts for a long time. It comes back often. You have trouble sleeping and focusing. What are the causes? The cause of tinnitus is often unknown. In some cases, you may get it if: You're around loud noises, such as from machines or music. An object gets stuck in your ear. Earwax builds up in Landscape architect. You drink a lot of alcohol or caffeine. You take certain medicines. You start to lose your hearing. You may also get it from some medical conditions. These may include: Ear or sinus infections. Heart diseases. High blood pressure. Allergies. Mnire's disease. Problems with your thyroid. A tumor. This is a growth of cells that isn't normal. A weak, bulging blood vessel called an aneurysm near your ear. What increases the risk? You may be more likely to get tinnitus if: You're around loud noises a lot. You're older. You drink alcohol. You smoke. What are the signs or symptoms? The main symptom is hearing a sound that there's no source for. It may sound like ringing. It may also sound like: Buzzing. Sizzling. Blowing air. Hissing. Whistling. Other sounds may include: Roaring. Running water. A musical note. Tapping. Humming. You may have symptoms in one ear or both ears. How is this diagnosed? Tinnitus is diagnosed based on your symptoms, your medical history, and an exam. Your provider may do a full hearing test if your tinnitus: Is in just one ear. Makes it hard for you to hear. Lasts 6 months or longer. You may also need to see an expert in hearing disorders called an audiologist.  They may ask you about your symptoms and how tinnitus affects your daily life. You may have other tests done. These may include: A CT scan. An MRI. An angiogram. This shows how blood flows through your blood vessels. How is this treated? Treatment may include: Therapy to help you manage the stress of living with tinnitus. Finding ways to mask or cover the sound of tinnitus. These include: Sound or white noise machines. Devices that fit in your ear and play sounds or music. A loud humidifier. Acoustic neural stimulation. This is when you use headphones to listen to music that has a special signal in it. Over time, this signal may change some of the pathways in your brain. This can make you less sensitive to tinnitus. This treatment is used for very severe cases. Using hearing aids or cochlear implants if your tinnitus is from hearing loss. If your tinnitus is caused by a medical condition, treating the condition may make it go away.  Follow these instructions at home: Managing symptoms     Try to avoid being in loud places or around loud noises. Wear earplugs or headphones when you're around loud noises. Find ways to reduce stress. These may include meditation, yoga, or deep breathing. Sleep with your head slightly raised. General instructions Take over-the-counter and prescription medicines only as told by your provider. Track the things that cause symptoms (triggers). Try to avoid these things. To stop your tinnitus from getting worse: Do not drink alcohol. Do  not have caffeine. Do not use any products that contain nicotine or tobacco. These products include cigarettes, chewing tobacco, and vaping devices, such as e-cigarettes. If you need help quitting, ask your provider. Avoid using too much salt. Get enough sleep each night. Where to find more information American Tinnitus Association: https://www.johnson-hamilton.org/ Contact a health care provider if: Your symptoms last for 3 weeks or longer without  stopping. You have sudden hearing loss. Your symptoms get worse or don't get better with home care. You can't manage the stress of living with tinnitus. Get help right away if: You get tinnitus after a head injury. You have tinnitus and: Dizziness. Nausea and vomiting. Loss of balance. A sudden, severe headache. Changes to your eyesight. Weakness in your face, arms, or legs. These symptoms may be an emergency. Get help right away. Call 911. Do not wait to see if the symptoms will go away. Do not drive yourself to the hospital. This information is not intended to replace advice given to you by your health care provider. Make sure you discuss any questions you have with your health care provider. Document Revised: 04/01/2022 Document Reviewed: 04/01/2022 Elsevier Patient Education  2024 [employer].

## 2022-11-10 ENCOUNTER — Encounter: Payer: Self-pay | Admitting: Family Medicine
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
## 2023-01-13 ENCOUNTER — Ambulatory Visit (INDEPENDENT_AMBULATORY_CARE_PROVIDER_SITE_OTHER): Payer: BLUE CROSS/BLUE SHIELD | Admitting: Sports Medicine

## 2023-01-13 VITALS — BP 134/80 | HR 87 | Ht 62.0 in | Wt 212.0 lb

## 2023-01-13 DIAGNOSIS — R270 Ataxia, unspecified: Secondary | ICD-10-CM

## 2023-01-13 DIAGNOSIS — R4189 Other symptoms and signs involving cognitive functions and awareness: Secondary | ICD-10-CM

## 2023-01-13 DIAGNOSIS — S060X0S Concussion without loss of consciousness, sequela: Secondary | ICD-10-CM

## 2023-01-13 DIAGNOSIS — R42 Dizziness and giddiness: Secondary | ICD-10-CM

## 2023-01-13 MED ORDER — DULOXETINE HCL 30 MG PO CPEP: 30.0000 mg | ORAL_CAPSULE | Freq: Every day | ORAL | 1 refills | Status: AC

## 2023-01-13 NOTE — Patient Instructions (Addendum)
 Cymbalta refill  Brain MRI  Follow up 5 days after MRI to discuss results

## 2023-01-20 ENCOUNTER — Other Ambulatory Visit: Payer: BLUE CROSS/BLUE SHIELD

## 2023-01-28 ENCOUNTER — Other Ambulatory Visit: Payer: BLUE CROSS/BLUE SHIELD

## 2023-02-18 ENCOUNTER — Other Ambulatory Visit: Payer: Self-pay

## 2023-02-18 MED ORDER — CHLORTHALIDONE 25 MG PO TABS: 25.0000 mg | ORAL_TABLET | Freq: Every day | ORAL | 1 refills | Status: DC

## 2023-02-18 MED ORDER — ALBUTEROL SULFATE HFA 108 (90 BASE) MCG/ACT IN AERS: 2.0000 | INHALATION_SPRAY | RESPIRATORY_TRACT | 3 refills | Status: AC | PRN

## 2023-08-06 ENCOUNTER — Telehealth: Payer: Self-pay | Admitting: *Deleted

## 2023-08-06 DIAGNOSIS — F329 Major depressive disorder, single episode, unspecified: Secondary | ICD-10-CM

## 2023-08-06 NOTE — Telephone Encounter (Signed)
 Referral order placed.

## 2023-08-06 NOTE — Telephone Encounter (Signed)
 Pt is requesting referral to   [facility] [address] [place] [address], [place], [place] [address]

## 2023-10-09 ENCOUNTER — Ambulatory Visit (INDEPENDENT_AMBULATORY_CARE_PROVIDER_SITE_OTHER): Admitting: Family Medicine

## 2023-10-09 ENCOUNTER — Encounter: Payer: Self-pay | Admitting: Family Medicine

## 2023-10-09 VITALS — BP 164/108 | HR 103 | Temp 98.3°F | Ht 62.0 in | Wt 201.8 lb

## 2023-10-09 DIAGNOSIS — I10 Essential (primary) hypertension: Secondary | ICD-10-CM | POA: Diagnosis not present

## 2023-10-09 DIAGNOSIS — R0981 Nasal congestion: Secondary | ICD-10-CM

## 2023-10-09 LAB — POC SOFIA 2 FLU + SARS ANTIGEN FIA
Influenza A, POC: NEGATIVE
Influenza B, POC: NEGATIVE
SARS Coronavirus 2 Ag: NEGATIVE

## 2023-10-09 MED ORDER — CHLORTHALIDONE 25 MG PO TABS: 25.0000 mg | ORAL_TABLET | Freq: Every day | ORAL | 1 refills | Status: AC

## 2023-10-09 NOTE — Progress Notes (Signed)
    SUBJECTIVE:   CHIEF COMPLAINT / HPI:   MH is a 38yo F that pf sick symptoms. - Reports about 4 days of fatigue, body aches, subjective - Having phlegmy cough - Having pressure in her forehead and back of head.  - Has sore throat - Has been giving tea and some tylenol   - Her son has similar symptoms a few days before her.  - Denies vomiting, diarrhea   PERTINENT  PMH / PSH: asthma  OBJECTIVE:   BP (!) 164/108   Pulse (!) 103   Temp 98.3 F (36.8 C)   Ht 5' 2 (1.575 m)   Wt 201 lb 12.8 oz (91.5 kg)   LMP 09/25/2023   SpO2 99%   BMI 36.91 kg/m      General: Alert, pleasant woman. NAD. HEENT: NCAT. MMM. Pressure to palpation of frontal sinuses CV: RRR, no murmurs  Resp: CTAB, no wheezing or crackles. Normal WOB on RA.  Abm: Soft, nontender, nondistended. BS present. Ext: Moves all ext spontaneously Skin: Warm, well perfused     ASSESSMENT/PLAN:   Assessment & Plan Nasal congestion Most cw viral URI. Covid/flu swab neg. Son's strep swab also neg. Lung exam clear, low cf asthma exac. - Counseled on supportive care and return precautions Essential hypertension, benign Above goal, likely from running out of chlorthalidone . -Refill provided for chlorthalidone  25 mg daily -BMP -Advised to follow-up for blood pressure recheck     Twyla Nearing, MD [facility]

## 2023-10-09 NOTE — Patient Instructions (Signed)
 You most likely have a viral upper respiratory infection (aka a cold). Your body will naturally fight off this infection over the next 7-14 days. You may have a lingering cough for 6-8 weeks after the infection is gone, this is normal and helps to clear debris out of your lungs.   -Drink plenty of water and fluids to stay hydrated.  Your urine should be clear or light yellow. -Take Tylenol  or ibuprofen as needed for fevers of the 100.3 Fahrenheit or higher -You can take 1 tablespoon of bees honey 4 times a day.  This can help soothe your throat.  Please seek further medical attention if you: - have trouble breathing - are vomiting uncontrollably - are unable to drink enough to stay hydrated - have fevers of 100.70F or higher for 3 days in a row

## 2023-10-10 LAB — BASIC METABOLIC PANEL WITH GFR
BUN/Creatinine Ratio: 16 (ref 9–23)
BUN: 14 mg/dL (ref 6–20)
CO2: 23 mmol/L (ref 20–29)
Calcium: 9.6 mg/dL (ref 8.7–10.2)
Chloride: 102 mmol/L (ref 96–106)
Creatinine, Ser: 0.9 mg/dL (ref 0.57–1.00)
Glucose: 84 mg/dL (ref 70–99)
Potassium: 3.9 mmol/L (ref 3.5–5.2)
Sodium: 139 mmol/L (ref 134–144)
eGFR: 84 mL/min/1.73 (ref >59)

## 2023-10-13 ENCOUNTER — Ambulatory Visit: Payer: Self-pay | Admitting: Family Medicine

## 2023-10-13 NOTE — Assessment & Plan Note (Signed)
 Above goal, likely from running out of chlorthalidone . -Refill provided for chlorthalidone  25 mg daily -BMP -Advised to follow-up for blood pressure recheck
